# Patient Record
Sex: Male | Born: 1955 | Race: Black or African American | Hispanic: No | Smoking: Light tobacco smoker
Health system: Southern US, Community
[De-identification: ages and names within clinical notes are randomized; demographics above are authoritative.]

## PROBLEM LIST (undated history)

## (undated) DIAGNOSIS — E785 Hyperlipidemia, unspecified: Secondary | ICD-10-CM

## (undated) DIAGNOSIS — K279 Peptic ulcer, site unspecified, unspecified as acute or chronic, without hemorrhage or perforation: Secondary | ICD-10-CM

## (undated) DIAGNOSIS — H269 Unspecified cataract: Secondary | ICD-10-CM

## (undated) DIAGNOSIS — M1990 Unspecified osteoarthritis, unspecified site: Secondary | ICD-10-CM

## (undated) HISTORY — DX: Unspecified osteoarthritis, unspecified site: M19.90

## (undated) HISTORY — PX: CERVICAL FUSION: SHX112

## (undated) HISTORY — DX: Unspecified cataract: H26.9

## (undated) HISTORY — DX: Hyperlipidemia, unspecified: E78.5

## (undated) HISTORY — PX: FRACTURE SURGERY: SHX138

## (undated) HISTORY — PX: DENTAL SURGERY: SHX609

---

## 1998-01-23 ENCOUNTER — Emergency Department (HOSPITAL_COMMUNITY): Admission: EM | Admit: 1998-01-23 | Discharge: 1998-01-23 | Payer: Self-pay | Admitting: Emergency Medicine

## 1998-05-13 ENCOUNTER — Emergency Department (HOSPITAL_COMMUNITY): Admission: EM | Admit: 1998-05-13 | Discharge: 1998-05-13 | Payer: Self-pay | Admitting: Emergency Medicine

## 2000-01-06 ENCOUNTER — Emergency Department (HOSPITAL_COMMUNITY): Admission: EM | Admit: 2000-01-06 | Discharge: 2000-01-06 | Payer: Self-pay | Admitting: Emergency Medicine

## 2000-10-08 ENCOUNTER — Emergency Department (HOSPITAL_COMMUNITY): Admission: EM | Admit: 2000-10-08 | Discharge: 2000-10-09 | Payer: Self-pay | Admitting: Emergency Medicine

## 2000-10-09 ENCOUNTER — Encounter: Payer: Self-pay | Admitting: Emergency Medicine

## 2006-05-11 ENCOUNTER — Emergency Department (HOSPITAL_COMMUNITY): Admission: EM | Admit: 2006-05-11 | Discharge: 2006-05-12 | Payer: Self-pay | Admitting: Emergency Medicine

## 2008-08-03 DIAGNOSIS — B1710 Acute hepatitis C without hepatic coma: Secondary | ICD-10-CM | POA: Insufficient documentation

## 2010-07-31 ENCOUNTER — Ambulatory Visit: Payer: Self-pay | Admitting: Nurse Practitioner

## 2010-07-31 DIAGNOSIS — M545 Low back pain: Secondary | ICD-10-CM

## 2010-07-31 DIAGNOSIS — K279 Peptic ulcer, site unspecified, unspecified as acute or chronic, without hemorrhage or perforation: Secondary | ICD-10-CM | POA: Insufficient documentation

## 2010-09-04 NOTE — Letter (Signed)
Summary: Handout Printed  Printed Handout:  - Pelvic Inflammatory Disease (PID) 

## 2010-09-04 NOTE — Assessment & Plan Note (Signed)
Summary: NEW - Establish Care   Vital Signs:  Patient profile:   55 year old male Height:      71.50 inches Weight:      182.3 pounds BMI:     25.16 BSA:     2.04 Temp:     97.5 degrees F oral Pulse rate:   60 / minute Pulse rhythm:   regular Resp:     16 per minute BP sitting:   104 / 80  (left arm) Cuff size:   regular  Vitals Entered By: Levon Hedger (July 31, 2010 10:15 AM)  Nutrition Counseling: Patient's BMI is greater than 25 and therefore counseled on weight management options. CC: new establish, Abdominal Pain Is Patient Diabetic? No Pain Assessment Patient in pain? yes     Location: back Intensity: 2-3  Does patient need assistance? Functional Status Self care Ambulation Normal   CC:  new establish and Abdominal Pain.  History of Present Illness:  Pt into the office to establish care. No previous PCP  No PMH PSH - oral surgery and right hand surgery s/p fracture from a fall.  Social - s/p incarceration 06/2010 Pt decided that he needed a primary care home since he is uninsured so he decided to establish care.  No acute problems today  Dyspepsia History:      He has no alarm features of dyspepsia including no history of melena, hematochezia, dysphagia, persistent vomiting, or involuntary weight loss > 5%.  There is a prior history of GERD.  The dominant symptom is heartburn or acid reflux.  An H-2 blocker medication is currently being taken.     Habits & Providers  Alcohol-Tobacco-Diet     Alcohol drinks/day: 0     Tobacco Status: never  Exercise-Depression-Behavior     Does Patient Exercise: yes     Exercise Counseling: not indicated; exercise is adequate     Type of exercise: weights     Have you felt down or hopeless? no     Have you felt little pleasure in things? no     Depression Counseling: not indicated; screening negative for depression     Drug Use: never  Allergies (verified): 1)  ! Penicillin  Past History:  Past  Surgical History: right hand surgery - 2005 (s/p fracture)  Family History: mother - breast cancer survivor father - deceased (unknown history) 3 siblings (2 sisters, 1 brother) 1 sister - breast cancer survivor  Social History: no children tobacco - none ETOH - none Drug - noneSmoking Status:  never Drug Use:  never Does Patient Exercise:  yes  Review of Systems General:  Denies fever. CV:  Denies chest pain or discomfort. Resp:  Denies cough. GI:  Complains of indigestion; denies constipation, nausea, and vomiting. MS:  Complains of joint pain and low back pain.  Physical Exam  General:  alert.   Head:  normocephalic.   Eyes:  glasses Lungs:  normal breath sounds.   Heart:  normal rate and regular rhythm.   Abdomen:  normal bowel sounds.   Neurologic:  alert & oriented X3.   Skin:  color normal.   Psych:  Oriented X3.     Impression & Recommendations:  Problem # 1:  PEPTIC ULCER DISEASE (ICD-533.90)  His updated medication list for this problem includes:    Ranitidine Hcl 300 Mg Tabs (Ranitidine hcl) ..... One tablet by mouth nightly for stomach  Problem # 2:  LUMBAGO (ICD-724.2) stable at this time  Problem # 3:  NEED PROPHYLACTIC VACCINATION&INOCULATION FLU (ICD-V04.81) given today   Complete Medication List: 1)  Ranitidine Hcl 300 Mg Tabs (Ranitidine hcl) .... One tablet by mouth nightly for stomach  Other Orders: Flu Vaccine 35yrs + ([address]) Admin 1st Vaccine ([address])  Dyspepsia Assessment/Plan:  Step Therapy: GERD Treatment Protocols:    Step-1: started    H-2 blocker chosen: Ranitidine 150mg  by mouth at bedtime  Patient Instructions: 1)  Schedule an appointment at the next available for a complete physical exam. 2)  No food after midnight before this visit. 3)  You will get fasting labs, EKG, rectal/prostate, tdap 4)  You have been given the flu vaccine today. Prescriptions: RANITIDINE HCL 300 MG TABS (RANITIDINE HCL) One tablet by mouth  nightly for stomach  #30 x 3   Entered and Authorized by:   Lehman Prom FNP   Signed by:   Lehman Prom FNP on 07/31/2010   Method used:   Print then Give to Patient   RxID:   [id number]    Orders Added: 1)  New Patient Level III [30865] 2)  Flu Vaccine 58yrs + [78469] 3)  Admin 1st Vaccine [62952]   Immunizations Administered:  Influenza Vaccine # 1:    Vaccine Type: Fluvax 3+    Site: right deltoid    Mfr: GlaxoSmithKline    Dose: 0.5 ml    Route: IM    Given by: Levon Hedger    Exp. Date: 01/31/2011    Lot #: [id number]    VIS given: 02/25/10 version given July 31, 2010.  Flu Vaccine Consent Questions:    Do you have a history of severe allergic reactions to this vaccine? no    Any prior history of allergic reactions to egg and/or gelatin? no    Do you have a sensitivity to the preservative Thimersol? no    Do you have a past history of Guillan-Barre Syndrome? no    Do you currently have an acute febrile illness? no    Have you ever had a severe reaction to latex? no    Vaccine information given and explained to patient? yes    ndc  [phone]  Immunizations Administered:  Influenza Vaccine # 1:    Vaccine Type: Fluvax 3+    Site: right deltoid    Mfr: GlaxoSmithKline    Dose: 0.5 ml    Route: IM    Given by: Levon Hedger    Exp. Date: 01/31/2011    Lot #: [id number]    VIS given: 02/25/10 version given July 31, 2010.   Colonoscopy  Procedure date:  05/05/2010  Findings:      Done at [facility] Polyps x 2 removed Hemorrhoids x 1  Comments:      no family history of colon cancer

## 2010-09-04 NOTE — Letter (Signed)
Summary: Handout Printed  Printed Handout:  - Peptic Ulcers

## 2010-09-23 ENCOUNTER — Encounter: Payer: Self-pay | Admitting: Nurse Practitioner

## 2010-09-23 ENCOUNTER — Encounter (INDEPENDENT_AMBULATORY_CARE_PROVIDER_SITE_OTHER): Payer: Self-pay | Admitting: Nurse Practitioner

## 2010-09-23 DIAGNOSIS — K5900 Constipation, unspecified: Secondary | ICD-10-CM | POA: Insufficient documentation

## 2010-09-23 LAB — CONVERTED CEMR LAB
Bilirubin Urine: NEGATIVE
Blood in Urine, dipstick: NEGATIVE
Glucose, Urine, Semiquant: NEGATIVE
Ketones, urine, test strip: NEGATIVE
Nitrite: NEGATIVE
Protein, U semiquant: NEGATIVE
Rapid HIV Screen: NEGATIVE
Specific Gravity, Urine: 1.01
Urobilinogen, UA: 0.2
WBC Urine, dipstick: NEGATIVE
pH: 6

## 2010-09-24 ENCOUNTER — Encounter (INDEPENDENT_AMBULATORY_CARE_PROVIDER_SITE_OTHER): Payer: Self-pay | Admitting: Nurse Practitioner

## 2010-09-24 LAB — CONVERTED CEMR LAB
Cholesterol: 169 mg/dL (ref 0–200)
HDL: 51 mg/dL (ref >39)
LDL Cholesterol: 103 mg/dL — ABNORMAL HIGH (ref 0–99)
Total CHOL/HDL Ratio: 3.3
Triglycerides: 73 mg/dL (ref <150)
VLDL: 15 mg/dL (ref 0–40)

## 2010-09-25 ENCOUNTER — Telehealth (INDEPENDENT_AMBULATORY_CARE_PROVIDER_SITE_OTHER): Payer: Self-pay | Admitting: Nurse Practitioner

## 2010-09-25 ENCOUNTER — Encounter (INDEPENDENT_AMBULATORY_CARE_PROVIDER_SITE_OTHER): Payer: Self-pay | Admitting: Nurse Practitioner

## 2010-09-25 DIAGNOSIS — E0590 Thyrotoxicosis, unspecified without thyrotoxic crisis or storm: Secondary | ICD-10-CM | POA: Insufficient documentation

## 2010-09-25 LAB — CONVERTED CEMR LAB
Free T4: 1.13 ng/dL (ref 0.80–1.80)
T3, Free: 2.6 pg/mL (ref 2.3–4.2)

## 2010-09-26 ENCOUNTER — Encounter (INDEPENDENT_AMBULATORY_CARE_PROVIDER_SITE_OTHER): Payer: Self-pay | Admitting: *Deleted

## 2010-09-30 NOTE — Progress Notes (Signed)
Summary: Lab results  Phone Note Outgoing Call   Summary of Call: notify pt that all labs are ok except he is hyperthyroid which means his thyroid is working too much. This may be due to an increase in his metabolism with change in weight as discussed during recent office visit. I would advise having tsh, free t3 and free t4 recheck in 6-8 weeks to see if it has normalized Initial call taken by: Lehman Prom FNP,  September 25, 2010 8:54 AM  Follow-up for Phone Call        [phone] # disconnected or no longer in service.  Will mail letter Benjamin Bender  September 26, 2010 10:08 AM   New Problems: HYPERTHYROIDISM, SUBCLINICAL (ICD-242.90)   New Problems: HYPERTHYROIDISM, SUBCLINICAL (ICD-242.90)

## 2010-09-30 NOTE — Assessment & Plan Note (Signed)
Summary: Complete Physical Exam   Vital Signs:  Patient profile:   55 year old male Weight:      177.5 pounds BMI:     24.50 Temp:     97.1 degrees F oral Pulse rate:   72 / minute Pulse rhythm:   regular Resp:     16 per minute BP sitting:   112 / 70  (left arm) Cuff size:   regular  Vitals Entered By: Levon Hedger (September 23, 2010 9:53 AM) CC: CPP....constipated, hard stools....loss of appetite Is Patient Diabetic? No Pain Assessment Patient in pain? no       Does patient need assistance? Functional Status Self care Ambulation Normal  Vision Screening:Left eye w/o correction: 20 / 15-1 Right Eye w/o correction: 20 / 15 Both eyes w/o correction:  20/ 15        Vision Entered By: Levon Hedger (September 23, 2010 10:23 AM)   CC:  CPP....constipated and hard stools....loss of appetite.  History of Present Illness:  Pt into the office for a complete physical exam  Optho - Wears reading glasses but no recent eye exam  Dental - No recent dental exam  Tdap - Last was over 10 years ago.  Breast - Mother is a cancer survivor. Sister is currently undergoing treatment.  Known history of Hepatitis C - Pt did start treatment but he did not continue due to side effects  Habits & Providers  Alcohol-Tobacco-Diet     Alcohol drinks/day: 0     Tobacco Status: never  Exercise-Depression-Behavior     Does Patient Exercise: yes     Exercise Counseling: not indicated; exercise is adequate     Type of exercise: weights     Have you felt down or hopeless? no     Have you felt little pleasure in things? no     Depression Counseling: not indicated; screening negative for depression     Drug Use: never  Comments: PHQ-9 score - 3  Allergies (verified): 1)  ! Penicillin  Review of Systems General:  Denies fever. Eyes:  Denies blurring. ENT:  Denies earache. CV:  Denies chest pain or discomfort. Resp:  Denies cough. GI:  Complains of constipation; denies  abdominal pain, nausea, and vomiting; Admits to changing his diet recently to only fruit during the day and a full meal at night. Some change in his stools as a consequence. GU:  Denies discharge. MS:  Denies joint pain. Derm:  Denies dryness. Neuro:  Denies headaches. Psych:  Denies anxiety and depression. Endo:  Denies excessive urination.  Physical Exam  General:  alert.   Head:  normocephalic.   Eyes:  pupils round.   Ears:  bil TM with minimal cerumen Nose:  no nasal discharge.   Mouth:  poor dentition.   Neck:  supple.   Chest Wall:  no deformities.   Breasts:  no gynecomastia.   Lungs:  normal breath sounds.   Heart:  normal rate and regular rhythm.   Abdomen:  normal bowel sounds.   Rectal:  no masses.   Genitalia:  circumcised.  no scrotal masses Prostate:  no gland enlargement.   Msk:  normal ROM.   Neurologic:  alert & oriented X3.   Skin:  color normal.   Psych:  Oriented X3.     Impression & Recommendations:  Problem # 1:  ROUTINE GYNECOLOGICAL EXAMINATION (ICD-V72.31) PHQ-9 score = 3 rec optho and dental exam guaiac negative  Orders: T-PSA ([address]) T-CBC w/Diff (09811-91478) Rapid  HIV  [phone]) T-Syphilis Test (RPR) [phone]) T-TSH [phone]) T-GC Probe, urine [phone]) UA Dipstick w/o Micro (manual) (96295) EKG w/ Interpretation (93000) Hemoccult Guaiac-1 spec.(in office) (82270)  Problem # 2:  HEPATITIS C (ICD-070.51) will confirm with labs today ended treatment in 2010 Orders: T-Hepattis C Genotype, DNA (28413-24401) T-Hepatitis C Viral Load (02725-36644) Protime INR (03474)  Problem # 3:  PEPTIC ULCER DISEASE (ICD-533.90)  His updated medication list for this problem includes:    Ranitidine Hcl 300 Mg Tabs (Ranitidine hcl) ..... One tablet by mouth nightly for stomach  Orders: T-Lipid Profile (25956-38756) T-Comprehensive Metabolic Panel (43329-51884)  Problem # 4:  CONSTIPATION (ICD-564.00) handout given pt  recently had a drastic change in his diet which may have attributed to change in bowels colonscopy done in 2010  Complete Medication List: 1)  Ranitidine Hcl 300 Mg Tabs (Ranitidine hcl) .... One tablet by mouth nightly for stomach  Patient Instructions: 1)  You will be notified of the labs notified of the results 2)  You are due for a tetanus but none available today in office. You can get on your next visit 3)  Follow up as needed Prescriptions: RANITIDINE HCL 300 MG TABS (RANITIDINE HCL) One tablet by mouth nightly for stomach  #30 x 11   Entered and Authorized by:   Lehman Prom FNP   Signed by:   Lehman Prom FNP on 09/23/2010   Method used:   Print then Give to Patient   RxID:   [id number]    Orders Added: 1)  Est. Patient age 41-64 [99396] 2)  T-Lipid Profile [phone] 3)  T-Comprehensive Metabolic Panel [80053-22900] 4)  T-PSA [27062-37628] 5)  T-CBC w/Diff [31517-61607] 6)  Rapid HIV  [92370] 7)  T-Syphilis Test (RPR) [37106-26948] 8)  T-TSH [54627-03500] 9)  T-GC Probe, urine [93818-29937] 10)  UA Dipstick w/o Micro (manual) [81002] 11)  EKG w/ Interpretation [93000] 12)  Hemoccult Guaiac-1 spec.(in office) [82270] 13)  T-Hepattis C Genotype, DNA [87902-82925] 14)  T-Hepatitis C Viral Load [87522-80119] 15)  Protime INR [85610]     EKG  Procedure date:  09/23/2010  Findings:      NSR   Laboratory Results   Urine Tests  Date/Time Received: September 23, 2010 11:55 AM   Routine Urinalysis   Color: lt. yellow Appearance: Clear Glucose: negative   (Normal Range: Negative) Bilirubin: negative   (Normal Range: Negative) Ketone: negative   (Normal Range: Negative) Spec. Gravity: 1.010   (Normal Range: 1.003-1.035) Blood: negative   (Normal Range: Negative) pH: 6.0   (Normal Range: 5.0-8.0) Protein: negative   (Normal Range: Negative) Urobilinogen: 0.2   (Normal Range: 0-1) Nitrite: negative   (Normal Range: Negative) Leukocyte  Esterace: negative   (Normal Range: Negative)      Other Tests  Rapid HIV: negative    Prevention & Chronic Care Immunizations   Influenza vaccine: Fluvax 3+  (07/31/2010)    Tetanus booster: 08/03/1998: historical per pt   Td booster deferral: Not available  (09/23/2010)    Pneumococcal vaccine: Not documented  Colorectal Screening   Hemoccult: Not documented    Colonoscopy: Done at [facility] Polyps x 2 removed Hemorrhoids x 1  (05/05/2010)   Colonoscopy action/deferral: no family history of colon cancer  (05/05/2010)  Other Screening   PSA: Not documented   PSA ordered.   Smoking status: never  (09/23/2010)  Lipids   Total Cholesterol: Not documented   LDL: Not documented   LDL Direct: Not documented   HDL: Not documented  Triglycerides: Not documented

## 2010-09-30 NOTE — Letter (Signed)
Summary: *HSN Results Follow up  [facility]  [address] [place], [place] [address]   Phone: [phone]  Fax: [phone]      09/26/2010   Benjamin Bender [address] [place], [place]  [address]   Dear  Benjamin Bender,                            ____S.Drinkard,FNP   ____D. Gore,FNP       ____B. McPherson,MD   ____V. Rankins,MD    ____E. Mulberry,MD    ____N. Daphine Deutscher, FNP  ____D. Reche Dixon, MD    ____K. Philipp Deputy, MD    ____Other     This letter is to inform you that your recent test(s):     _______Lab Test Results         _______ is within acceptable limits  _______ requires a medication change  _______ requires a follow-up lab visit  _______ requires a follow-up visit with your provider   Comments:  We have been trying to reach you at [phone].  Please contact the office at your earliest convenience.       _________________________________________________________ If you have any questions, please contact our office                     Sincerely,  Levon Hedger [facility]

## 2010-09-30 NOTE — Progress Notes (Signed)
Summary: Office Visit//DEPRESSION SCREENIG  Office Visit//DEPRESSION SCREENIG   Imported By: Arta Bruce 09/23/2010 15:32:34  _____________________________________________________________________  External Attachment:    Type:   Image     Comment:   External Document

## 2010-09-30 NOTE — Assessment & Plan Note (Signed)
Summary: Lipids  Nurse Visit   Allergies: 1)  ! Penicillin  Orders Added: 1)  No Charge Patient Arrived (NCPA0) [NCPA0] 2)  T-Lipid Profile [[address]]

## 2010-09-30 NOTE — Letter (Signed)
Summary: Handout Printed  Printed Handout:  - Constipation, Easy-to-Read

## 2010-10-02 LAB — CONVERTED CEMR LAB
ALT: 17 units/L (ref 0–53)
AST: 20 units/L (ref 0–37)
BUN: 12 mg/dL (ref 6–23)
Basophils Absolute: 0 10*3/uL (ref 0.0–0.1)
Basophils Relative: 0 % (ref 0–1)
Creatinine, Ser: 1.03 mg/dL (ref 0.40–1.50)
Eosinophils Relative: 3 % (ref 0–5)
HCT: 45.7 % (ref 39.0–52.0)
HCV Quantitative: 7070000 intl units/mL — ABNORMAL HIGH (ref <43)
Hemoglobin: 15.3 g/dL (ref 13.0–17.0)
MCHC: 33.5 g/dL (ref 30.0–36.0)
MCV: 90.3 fL (ref 78.0–100.0)
Monocytes Absolute: 0.6 10*3/uL (ref 0.1–1.0)
Monocytes Relative: 9 % (ref 3–12)
RDW: 14 % (ref 11.5–15.5)
TSH: 0.161 microintl units/mL — ABNORMAL LOW (ref 0.350–4.500)
Total Bilirubin: 0.9 mg/dL (ref 0.3–1.2)

## 2011-02-28 ENCOUNTER — Emergency Department (HOSPITAL_COMMUNITY)
Admission: EM | Admit: 2011-02-28 | Discharge: 2011-02-28 | Disposition: A | Discharge status: Home / Self Care | Payer: No Typology Code available for payment source | Attending: Emergency Medicine | Admitting: Emergency Medicine

## 2011-02-28 DIAGNOSIS — Y9289 Other specified places as the place of occurrence of the external cause: Secondary | ICD-10-CM | POA: Insufficient documentation

## 2011-02-28 DIAGNOSIS — M542 Cervicalgia: Secondary | ICD-10-CM | POA: Insufficient documentation

## 2011-02-28 DIAGNOSIS — M25519 Pain in unspecified shoulder: Secondary | ICD-10-CM | POA: Insufficient documentation

## 2011-02-28 DIAGNOSIS — F17200 Nicotine dependence, unspecified, uncomplicated: Secondary | ICD-10-CM | POA: Insufficient documentation

## 2011-02-28 DIAGNOSIS — M62838 Other muscle spasm: Secondary | ICD-10-CM | POA: Insufficient documentation

## 2013-02-09 ENCOUNTER — Ambulatory Visit: Payer: No Typology Code available for payment source | Attending: Family Medicine | Admitting: Internal Medicine

## 2013-02-09 VITALS — BP 130/82 | HR 53 | Temp 99.1°F | Resp 16 | Ht 70.5 in | Wt 144.0 lb

## 2013-02-09 DIAGNOSIS — Z8619 Personal history of other infectious and parasitic diseases: Secondary | ICD-10-CM | POA: Insufficient documentation

## 2013-02-09 DIAGNOSIS — B182 Chronic viral hepatitis C: Secondary | ICD-10-CM

## 2013-02-09 DIAGNOSIS — R634 Abnormal weight loss: Secondary | ICD-10-CM | POA: Insufficient documentation

## 2013-02-09 NOTE — Progress Notes (Signed)
Patient ID: NYMIR RINGLER, male   DOB: 1956/02/10, 57 y.o.   MRN: [id number]  Patient Demographics  Ryzen Deady, is a 57 y.o. male  CSN: [id number]  MRN: [id number]  DOB - [birth year]  Outpatient Primary MD for the patient is No primary provider on file.   With History of -  Past medical history of hepatitis C, status post interferon treatment for few months but he quit due to side effects, status post colonoscopy with all removal 3 years ago.   in for   Chief Complaint  Patient presents with  . Establish Care  . Hepatitis C     HPI  Lennex Pietila  is a 57 y.o. male, with history of hepatitis C who did not complete his full treatment due to side effects, colonic polyps which were removed 3 years ago, of note patient had been incarcerated for few years and has been recently released from the hospital, he is here to establish care besides mild itching which is intermittent due to dry skin he has no other subjective complaints except unintentional 30-40 pound weight loss over the last 1 year .    Review of Systems    In addition to the HPI above,  No Fever-chills, No Headache, No changes with Vision or hearing, No problems swallowing food or Liquids, No Chest pain, Cough or Shortness of Breath, No Abdominal pain, No Nausea or Vommitting, Bowel movements are regular, No Blood in stool or Urine, No dysuria, No new skin rashes or bruises, No new joints pains-aches,  No new weakness, tingling, numbness in any extremity, No recent weight gain, No polyuria, polydypsia or polyphagia, No significant Mental Stressors.  A full 10 point Review of Systems was done, except as stated above, all other Review of Systems were negative.   Social History History  Substance Use Topics  . Smoking status: Light Tobacco Smoker -- 0.25 packs/day  . Smokeless tobacco: Not on file  . Alcohol Use: Yes     Comment: occasionally     Family History No history of liver cancer  Prior  to Admission medications   Not on File    Allergies  Allergen Reactions  . Penicillins     REACTION: fainting during childhood    Physical Exam  Vitals  Blood pressure 130/82, pulse 53, temperature 99.1 F (37.3 C), temperature source Oral, resp. rate 16, height 5' 10.5" (1.791 m), weight 144 lb (65.318 kg), SpO2 99.00%.   1. General thin African American male middle-aged sitting on the examination table in no apparent distress  2. Normal affect and insight, Not Suicidal or Homicidal, Awake Alert, Oriented X 3.  3. No F.N deficits, ALL C.Nerves Intact, Strength 5/5 all 4 extremities, Sensation intact all 4 extremities, Plantars down going.  4. Ears and Eyes appear Normal, Conjunctivae clear, PERRLA. Moist Oral Mucosa.  5. Supple Neck, No JVD, No cervical lymphadenopathy appriciated, No Carotid Bruits.  6. Symmetrical Chest wall movement, Good air movement bilaterally, CTAB.  7. RRR, No Gallops, Rubs or Murmurs, No Parasternal Heave.  8. Positive Bowel Sounds, Abdomen Soft, Non tender, No organomegaly appriciated,No rebound -guarding or rigidity.  9.  No Cyanosis, Normal Skin Turgor, No Skin Rash or Bruise.  10. Good muscle tone,  joints appear normal , no effusions, Normal ROM.  11. No Palpable Lymph Nodes in Neck or Axillae    Data Review  CBC No results found for this basename: WBC, HGB, HCT, PLT, MCV, MCH, MCHC, RDW, NEUTRABS, LYMPHSABS, MONOABS, EOSABS,  BASOSABS, BANDABS, BANDSABD,  in the last 168 hours ------------------------------------------------------------------------------------------------------------------  Chemistries  No results found for this basename: NA, K, CL, CO2, GLUCOSE, BUN, CREATININE, GFRCGP, CALCIUM, MG, AST, ALT, ALKPHOS, BILITOT,  in the last 168 hours ------------------------------------------------------------------------------------------------------------------ estimated creatinine clearance is 73.1 ml/min (by C-G formula based on  Cr of 1.03). ------------------------------------------------------------------------------------------------------------------ No results found for this basename: TSH, T4TOTAL, FREET3, T3FREE, THYROIDAB,  in the last 72 hours   Coagulation profile No results found for this basename: INR, PROTIME,  in the last 168 hours ------------------------------------------------------------------------------------------------------------------- No results found for this basename: DDIMER,  in the last 72 hours -------------------------------------------------------------------------------------------------------------------  Cardiac Enzymes No results found for this basename: CK, CKMB, TROPONINI, MYOGLOBIN,  in the last 168 hours ------------------------------------------------------------------------------------------------------------------ No components found with this basename: POCBNP,    ---------------------------------------------------------------------------------------------------------------  Urinalysis    Component Value Date/Time   COLORURINE lt. yellow 09/23/2010 0953   APPEARANCEUR Clear 09/23/2010 0953   LABSPEC 1.010 09/23/2010 0953   PHURINE 6.0 09/23/2010 0953   HGBUR negative 09/23/2010 0953   BILIRUBINUR negative 09/23/2010 0953   UROBILINOGEN 0.2 09/23/2010 0953   NITRITE negative 09/23/2010 0953       Assessment and plan  History of hepatitis C. Unintentional weight loss over the last few years. Patient had stopped his hepatitis C treatment midway due to side effects, I will refer him to GI to monitor his hepatitis C closely and to rule out any liver malignancy. We'll also order an HIV serology panel.  History of smoking requested to quit smoking.    Routine health maintenance.  Screening labs. CBC, CMP, TSH, A1c, lipid panel, PSA, HIV serology he will come back in a week for followup  Colonoscopy was done 3 years ago with polyps removal we will have him follow with  GI physician for his hepatitis.   Immunizations no tetanus vaccine available in the clinic today, we will give it to him his next visit        Leroy Sea M.D on 02/09/2013 at 4:38 PM

## 2013-02-09 NOTE — Progress Notes (Signed)
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 Denies symptoms of jaundice, but states he has dry scaling skin with itching and redness around both eyes.

## 2013-02-10 LAB — CBC
HCT: 42.4 % (ref 39.0–52.0)
MCH: 30.5 pg (ref 26.0–34.0)
MCV: 88.5 fL (ref 78.0–100.0)
Platelets: 146 10*3/uL — ABNORMAL LOW (ref 150–400)
RBC: 4.79 MIL/uL (ref 4.22–5.81)
WBC: 6 10*3/uL (ref 4.0–10.5)

## 2013-02-10 LAB — COMPLETE METABOLIC PANEL WITH GFR
ALT: 36 U/L (ref 0–53)
AST: 27 U/L (ref 0–37)
Alkaline Phosphatase: 79 U/L (ref 39–117)
BUN: 11 mg/dL (ref 6–23)
Calcium: 9.5 mg/dL (ref 8.4–10.5)
Chloride: 103 mEq/L (ref 96–112)
Creat: 1.1 mg/dL (ref 0.50–1.35)
Potassium: 4.1 mEq/L (ref 3.5–5.3)

## 2013-02-10 LAB — LIPID PANEL
Cholesterol: 150 mg/dL (ref 0–200)
HDL: 46 mg/dL (ref >39)
Total CHOL/HDL Ratio: 3.3 Ratio
Triglycerides: 138 mg/dL (ref <150)
VLDL: 28 mg/dL (ref 0–40)

## 2013-02-10 LAB — TSH: TSH: 1.469 u[IU]/mL (ref 0.350–4.500)

## 2013-02-14 ENCOUNTER — Ambulatory Visit: Payer: Self-pay

## 2013-02-23 ENCOUNTER — Ambulatory Visit: Payer: No Typology Code available for payment source | Attending: Family Medicine | Admitting: Family Medicine

## 2013-02-23 ENCOUNTER — Encounter: Payer: Self-pay | Admitting: Family Medicine

## 2013-02-23 ENCOUNTER — Telehealth: Payer: Self-pay | Admitting: Family Medicine

## 2013-02-23 DIAGNOSIS — B1920 Unspecified viral hepatitis C without hepatic coma: Secondary | ICD-10-CM | POA: Insufficient documentation

## 2013-02-23 NOTE — Progress Notes (Signed)
Patient ID: Benjamin Bender, male   DOB: 1956/01/22, 57 y.o.   MRN: [id number]  CC: follow up   HPI: Pt reports that he would like to have his test results.  Pt says that he is interested in pursuing further treatment for his Hep C.    Allergies  Allergen Reactions  . Penicillins     REACTION: fainting during childhood   No past medical history on file. No current outpatient prescriptions on file prior to visit.   No current facility-administered medications on file prior to visit.   No family history on file. History   Social History  . Marital Status: Divorced    Spouse Name: N/A    Number of Children: N/A  . Years of Education: N/A   Occupational History  . Not on file.   Social History Main Topics  . Smoking status: Light Tobacco Smoker -- 0.25 packs/day  . Smokeless tobacco: Not on file  . Alcohol Use: Yes     Comment: occasionally  . Drug Use: Yes    Special: Marijuana  . Sexually Active: Not on file   Other Topics Concern  . Not on file   Social History Narrative  . No narrative on file    Review of Systems  Constitutional: Negative for fever, chills, diaphoresis, activity change, appetite change and fatigue.  HENT: Negative for ear pain, nosebleeds, congestion, facial swelling, rhinorrhea, neck pain, neck stiffness and ear discharge.   Eyes: Negative for pain, discharge, redness, itching and visual disturbance.  Respiratory: Negative for cough, choking, chest tightness, shortness of breath, wheezing and stridor.   Cardiovascular: Negative for chest pain, palpitations and leg swelling.  Gastrointestinal: Negative for abdominal distention.  Genitourinary: Negative for dysuria, urgency, frequency, hematuria, flank pain, decreased urine volume, difficulty urinating and dyspareunia.  Musculoskeletal: Negative for back pain, joint swelling, arthralgias and gait problem.  Neurological: Negative for dizziness, tremors, seizures, syncope, facial asymmetry, speech  difficulty, weakness, light-headedness, numbness and headaches.  Hematological: Negative for adenopathy. Does not bruise/bleed easily.  Psychiatric/Behavioral: Negative for hallucinations, behavioral problems, confusion, dysphoric mood, decreased concentration and agitation.    Objective:   Filed Vitals:   02/23/13 1213  BP: 138/75  Pulse: 65  Temp: 98.3 F (36.8 C)  Resp: 16    Physical Exam  Constitutional: Appears well-developed and well-nourished. No distress.  HENT: Normocephalic. External right and left ear normal. Oropharynx is clear and moist.  Eyes: Conjunctivae and EOM are normal. PERRLA, no scleral icterus.  Neck: Normal ROM. Neck supple. No JVD. No tracheal deviation. No thyromegaly.  CVS: RRR, S1/S2 +, no murmurs, no gallops, no carotid bruit.  Pulmonary: Effort and breath sounds normal, no stridor, rhonchi, wheezes, rales.  Abdominal: Soft. BS +,  no distension, tenderness, rebound or guarding.  Musculoskeletal: Normal range of motion. No edema and no tenderness.  Lymphadenopathy: No lymphadenopathy noted, cervical, inguinal. Neuro: Alert. Normal reflexes, muscle tone coordination. No cranial nerve deficit. Skin: Skin is warm and dry. No rash noted. Not diaphoretic. No erythema. No pallor.  Psychiatric: Normal mood and affect. Behavior, judgment, thought content normal.   Lab Results  Component Value Date   WBC 6.0 02/09/2013   HGB 14.6 02/09/2013   HCT 42.4 02/09/2013   MCV 88.5 02/09/2013   PLT 146* 02/09/2013   Lab Results  Component Value Date   CREATININE 1.10 02/09/2013   BUN 11 02/09/2013   NA 141 02/09/2013   K 4.1 02/09/2013   CL 103 02/09/2013   CO2  29 02/09/2013    Lab Results  Component Value Date   HGBA1C 5.4 02/09/2013   Lipid Panel     Component Value Date/Time   CHOL 150 02/09/2013 1645   TRIG 138 02/09/2013 1645   HDL 46 02/09/2013 1645   CHOLHDL 3.3 02/09/2013 1645   VLDL 28 02/09/2013 1645   LDLCALC 76 02/09/2013 1645       Assessment and  plan:   Patient Active Problem List   Diagnosis Date Noted  . HYPERTHYROIDISM, SUBCLINICAL 09/25/2010  . CONSTIPATION 09/23/2010  . PEPTIC ULCER DISEASE 07/31/2010  . LUMBAGO 07/31/2010  . HEPATITIS C 08/03/2008   Pt is interested in pursuing treatment for Hepatitis C.    Referral to Hepatitis C clinic for eval and treatment consideration   The patient was counseled on the dangers of tobacco use, and was advised to quit.  Reviewed strategies to maximize success, including removing cigarettes and smoking materials from environment.   Weight loss - encouraged pt to try supplements like boost or ensure for extra calories  RTC in 4 months  The patient was given clear instructions to go to ER or return to medical center if symptoms don't improve, worsen or new problems develop.  The patient verbalized understanding.  The patient was told to call to get any lab results if not heard anything in the next week.    Rodney Langton, MD, CDE, FAAFP [facility] [place], [place]

## 2013-02-23 NOTE — Patient Instructions (Addendum)
Smoking Cessation, Tips for Success YOU CAN QUIT SMOKING If you are ready to quit smoking, congratulations! You have chosen to help yourself be healthier. Cigarettes bring nicotine, tar, carbon monoxide, and other irritants into your body. Your lungs, heart, and blood vessels will be able to work better without these poisons. There are many different ways to quit smoking. Nicotine gum, nicotine patches, a nicotine inhaler, or nicotine nasal spray can help with physical craving. Hypnosis, support groups, and medicines help break the habit of smoking. Here are some tips to help you quit for good.  Throw away all cigarettes.  Clean and remove all ashtrays from your home, work, and car.  On a card, write down your reasons for quitting. Carry the card with you and read it when you get the urge to smoke.  Cleanse your body of nicotine. Drink enough water and fluids to keep your urine clear or pale yellow. Do this after quitting to flush the nicotine from your body.  Learn to predict your moods. Do not let a bad situation be your excuse to have a cigarette. Some situations in your life might tempt you into wanting a cigarette.  Never have "just one" cigarette. It leads to wanting another and another. Remind yourself of your decision to quit.  Change habits associated with smoking. If you smoked while driving or when feeling stressed, try other activities to replace smoking. Stand up when drinking your coffee. Brush your teeth after eating. Sit in a different chair when you read the paper. Avoid alcohol while trying to quit, and try to drink fewer caffeinated beverages. Alcohol and caffeine may urge you to smoke.  Avoid foods and drinks that can trigger a desire to smoke, such as sugary or spicy foods and alcohol.  Ask people who smoke not to smoke around you.  Have something planned to do right after eating or having a cup of coffee. Take a walk or exercise to perk you up. This will help to keep you  from overeating.  Try a relaxation exercise to calm you down and decrease your stress. Remember, you may be tense and nervous for the first 2 weeks after you quit, but this will pass.  Find new activities to keep your hands busy. Play with a pen, coin, or rubber band. Doodle or draw things on paper.  Brush your teeth right after eating. This will help cut down on the craving for the taste of tobacco after meals. You can try mouthwash, too.  Use oral substitutes, such as lemon drops, carrots, a cinnamon stick, or chewing gum, in place of cigarettes. Keep them handy so they are available when you have the urge to smoke.  When you have the urge to smoke, try deep breathing.  Designate your home as a nonsmoking area.  If you are a heavy smoker, ask your caregiver about a prescription for nicotine chewing gum. It can ease your withdrawal from nicotine.  Reward yourself. Set aside the cigarette money you save and buy yourself something nice.  Look for support from others. Join a support group or smoking cessation program. Ask someone at home or at work to help you with your plan to quit smoking.  Always ask yourself, "Do I need this cigarette or is this just a reflex?" Tell yourself, "Today, I choose not to smoke," or "I do not want to smoke." You are reminding yourself of your decision to quit, even if you do smoke a cigarette. HOW WILL I FEEL WHEN   I QUIT SMOKING?  The benefits of not smoking start within days of quitting.  You may have symptoms of withdrawal because your body is used to nicotine (the addictive substance in cigarettes). You may crave cigarettes, be irritable, feel very hungry, cough often, get headaches, or have difficulty concentrating.  The withdrawal symptoms are only temporary. They are strongest when you first quit but will go away within 10 to 14 days.  When withdrawal symptoms occur, stay in control. Think about your reasons for quitting. Remind yourself that these are  signs that your body is healing and getting used to being without cigarettes.  Remember that withdrawal symptoms are easier to treat than the major diseases that smoking can cause.  Even after the withdrawal is over, expect periodic urges to smoke. However, these cravings are generally short-lived and will go away whether you smoke or not. Do not smoke!  If you relapse and smoke again, do not lose hope. Most smokers quit 3 times before they are successful.  If you relapse, do not give up! Plan ahead and think about what you will do the next time you get the urge to smoke. LIFE AS A NONSMOKER: MAKE IT FOR A MONTH, MAKE IT FOR LIFE Day 1: Hang this page where you will see it every day. Day 2: Get rid of all ashtrays, matches, and lighters. Day 3: Drink water. Breathe deeply between sips. Day 4: Avoid places with smoke-filled air, such as bars, clubs, or the smoking section of restaurants. Day 5: Keep track of how much money you save by not smoking. Day 6: Avoid boredom. Keep a good book with you or go to the movies. Day 7: Reward yourself! One week without smoking! Day 8: Make a dental appointment to get your teeth cleaned. Day 9: Decide how you will turn down a cigarette before it is offered to you. Day 10: Review your reasons for quitting. Day 11: Distract yourself. Stay active to keep your mind off smoking and to relieve tension. Take a walk, exercise, read a book, do a crossword puzzle, or try a new hobby. Day 12: Exercise. Get off the bus before your stop or use stairs instead of escalators. Day 13: Call on friends for support and encouragement. Day 14: Reward yourself! Two weeks without smoking! Day 15: Practice deep breathing exercises. Day 16: Bet a friend that you can stay a nonsmoker. Day 17: Ask to sit in nonsmoking sections of restaurants. Day 18: Hang up "No Smoking" signs. Day 19: Think of yourself as a nonsmoker. Day 20: Each morning, tell yourself you will not smoke. Day  21: Reward yourself! Three weeks without smoking! Day 22: Think of smoking in negative ways. Remember how it stains your teeth, gives you bad breath, and leaves you short of breath. Day 23: Eat a nutritious breakfast. Day 24:Do not relive your days as a smoker. Day 25: Hold a pencil in your hand when talking on the telephone. Day 26: Tell all your friends you do not smoke. Day 27: Think about how much better food tastes. Day 28: Remember, one cigarette is one too many. Day 29: Take up a hobby that will keep your hands busy. Day 30: Congratulations! One month without smoking! Give yourself a big reward. Your caregiver can direct you to community resources or hospitals for support, which may include:  Group support.  Education.  Hypnosis.  Subliminal therapy. Document Released: 04/17/2004 Document Revised: 10/12/2011 Document Reviewed: 05/06/2009 [facility] Patient Information 2014 [place], [place]. Hepatitis  C Hepatitis C is a viral infection of the liver. Infection may go undetected for months or years because symptoms may be absent or very mild. Chronic liver disease is the main danger of hepatitis C. This may lead to scarring of the liver (cirrhosis), liver failure, and liver cancer. CAUSES  Hepatitis C is caused by the hepatitis C virus (HCV). Formerly, hepatitis C infections were most commonly transmitted through blood transfusions. In the early 1990s, routine testing of donated blood for hepatitis C and exclusion of blood that tests positive for HCV began. Now, HCV is most commonly transmitted from person to person through injection drug use, sharing needles, or sex with an infected person. A caregiver may also get the infection from exposure to the blood of an infected patient by way of a cut or needle stick.  SYMPTOMS  Acute Phase Many cases of acute HCV infection are mild and cause few problems.Some people may not even realize they are sick.Symptoms in others may last a few weeks to  several months and include:  Feeling very tired.  Loss of appetite.  Nausea.  Vomiting.  Abdominal pain.  Dark yellow urine.  Yellow skin and eyes (jaundice).  Itching of the skin. Chronic Phase  Between 50% to 85% of people who get HCV infection become "chronic carriers." They often have no symptoms, but the virus stays in their body.They may spread the virus to others and can get long-term liver disease.  Many people with chronic HCV infection remain healthy for many years. However, up to 1 in 5 chronically infected people may develop severe liver diseases including scarring of the liver (cirrhosis), liver failure, or liver cancer. DIAGNOSIS  Diagnosis of hepatitis C infection is made by testing blood for the presence of hepatitis C viral particles called RNA. Other tests may also be done to measure the status of current liver function, exclude other liver problems, or assess liver damage. TREATMENT  Treatment with many antiviral drugs is available and recommended for some patients with chronic HCV infection. Drug treatment is generally considered appropriate for patients who:  Are 12 years of age or older.  Have a positive test for HCV particles in the blood.  Have a liver tissue sample (biopsy) that shows chronic hepatitis and significant scarring (fibrosis).  Do not have signs of liver failure.  Have acceptable blood test results that confirm the wellness of other body organs.  Are willing to be treated and conform to treatment requirements.  Have no other circumstances that would prevent treatment from being recommended (contraindications). All people who are offered and choose to receive drug treatment must understand that careful medical follow up for many months and even years is crucial in order to make successful care possible. The goal of drug treatment is to eliminate any evidence of HCV in the blood on a long-term basis. This is called a "sustained virologic  response" or SVR. Achieving a SVR is associated with a decrease in the chance of life-threatening liver problems, need for a liver transplant, liver cancer rates, and liver-related complications. Successful treatment currently requires taking treatment drugs for at least 24 weeks and up to 72 weeks. An injected drug (interferon) given weekly and an oral antiviral medicine taken daily are usually prescribed. Side effects from these drugs are common and some may be very serious. Your response to treatment must be carefully monitored by both you and your caregiver throughout the entire treatment period. PREVENTION There is no vaccine for hepatitis C. The only  way to prevent the disease is to reduce the risk of exposure to the virus.   Avoid sharing drug needles or personal items like toothbrushes, razors, and nail clippers with an infected person.  Healthcare workers need to avoid injuries and wear appropriate protective equipment such as gloves, gowns, and face masks when performing invasive medical or nursing procedures. HOME CARE INSTRUCTIONS  To avoid making your liver disease worse:  Strictly avoid drinking alcohol.  Carefully review all new prescriptions of medicines with your caregiver. Ask your caregiver which drugs you should avoid. The following drugs are toxic to the liver, and your caregiver may tell you to avoid them:  Isoniazid.  Methyldopa.  Acetaminophen.  Anabolic steroids (muscle-building drugs).  Erythromycin.  Oral contraceptives (birth control pills).  Check with your caregiver to make sure medicine you are currently taking will not be harmful.  Periodic blood tests may be required. Follow your caregiver's advice about when you should have blood tests.  Avoid a sexual relationship until advised otherwise by your caregiver.  Avoid activities that could expose other people to your blood. Examples include sharing a toothbrush, nail clippers, razors, and needles.  Bed  rest is not necessary, but it may make you feel better. Recovery time is not related to the amount of rest you receive.  This infection is contagious. Follow your caregiver's instructions in order to avoid spread of the infection. SEEK IMMEDIATE MEDICAL CARE IF:  You have increasing fatigue or weakness.  You have an oral temperature above 102 F (38.9 C), not controlled by medicine.  You develop loss of appetite, nausea, or vomiting.  You develop jaundice.  You develop easy bruising or bleeding.  You develop any severe problems as a result of your treatment. MAKE SURE YOU:   Understand these instructions.  Will watch your condition.  Will get help right away if you are not doing well or get worse. Document Released: 07/17/2000 Document Revised: 10/12/2011 Document Reviewed: 11/19/2010 [facility] Patient Information 2014 [place], [place].

## 2013-02-23 NOTE — Telephone Encounter (Signed)
Pt calling Arna Medici back about referral.

## 2013-02-23 NOTE — Progress Notes (Signed)
HERE TO F/U LAB RESULTS X 2 WEEKS AGO. DENIES PAIN.VSS

## 2013-02-27 NOTE — Telephone Encounter (Signed)
lvm with relative for him to call me back

## 2013-06-08 ENCOUNTER — Other Ambulatory Visit: Payer: Self-pay

## 2017-06-04 ENCOUNTER — Encounter (HOSPITAL_BASED_OUTPATIENT_CLINIC_OR_DEPARTMENT_OTHER): Payer: Self-pay | Admitting: Adult Health

## 2017-06-04 ENCOUNTER — Emergency Department (HOSPITAL_BASED_OUTPATIENT_CLINIC_OR_DEPARTMENT_OTHER)
Admission: EM | Admit: 2017-06-04 | Discharge: 2017-06-04 | Disposition: A | Discharge status: Home / Self Care | Payer: Self-pay | Attending: Emergency Medicine | Admitting: Emergency Medicine

## 2017-06-04 DIAGNOSIS — K5900 Constipation, unspecified: Secondary | ICD-10-CM | POA: Insufficient documentation

## 2017-06-04 DIAGNOSIS — F17210 Nicotine dependence, cigarettes, uncomplicated: Secondary | ICD-10-CM | POA: Insufficient documentation

## 2017-06-04 DIAGNOSIS — R1013 Epigastric pain: Secondary | ICD-10-CM | POA: Insufficient documentation

## 2017-06-04 DIAGNOSIS — R1010 Upper abdominal pain, unspecified: Secondary | ICD-10-CM | POA: Insufficient documentation

## 2017-06-04 HISTORY — DX: Peptic ulcer, site unspecified, unspecified as acute or chronic, without hemorrhage or perforation: K27.9

## 2017-06-04 MED ORDER — RANITIDINE HCL 300 MG PO TABS: 300.0000 mg | ORAL_TABLET | Freq: Every day | ORAL | 0 refills | Status: DC

## 2017-06-04 MED ORDER — POLYETHYLENE GLYCOL 3350 17 G PO PACK: 17.0000 g | PACK | Freq: Every day | ORAL | 0 refills | Status: DC

## 2017-06-04 NOTE — ED Provider Notes (Signed)
[facility] EMERGENCY DEPARTMENT Provider Note   CSN: [id number] Arrival date & time: 06/04/17  1025     History   Chief Complaint Chief Complaint  Patient presents with  . Constipation    HPI Benjamin Bender is a 61 y.o. male.  HPI Patient presents with constipation.  States he has had no bowel movement in the last week.  Did take a milk of magnesia laxative this morning and had some mild bowel movements.  States he was having severe upper abdominal pain earlier.  States he normally does not go a long time without bowel movements.  He has however a [facility] cocaine detox and states his diet is not his normal diet.  No fevers.  No blood in the stool but states his stool is a little darker.  No nausea or vomiting.  States the pain was severe earlier today but now feels as if it is moved down and has improved.  History of "healed ulcers".  Previously had been on Zantac.  No previous aortic history. Past Medical History:  Diagnosis Date  . Peptic ulcer     Patient Active Problem List   Diagnosis Date Noted  . Hepatitis C infection 02/23/2013  . HYPERTHYROIDISM, SUBCLINICAL 09/25/2010  . CONSTIPATION 09/23/2010  . PEPTIC ULCER DISEASE 07/31/2010  . LUMBAGO 07/31/2010  . HEPATITIS C 08/03/2008    No past surgical history on file.     Home Medications    Prior to Admission medications   Medication Sig Start Date End Date Taking? Authorizing Provider  polyethylene glycol (MIRALAX / GLYCOLAX) packet Take 17 g by mouth daily. 06/04/17   Benjiman CorePickering, Tu Shimmel, MD  ranitidine (ZANTAC) 300 MG tablet Take 1 tablet (300 mg total) by mouth at bedtime. 06/04/17   Benjiman CorePickering, Tyvion Edmondson, MD    Family History No family history on file.  Social History Social History  Substance Use Topics  . Smoking status: Light Tobacco Smoker    Packs/day: 0.25  . Smokeless tobacco: Not on file  . Alcohol use Yes     Comment: occasionally     Allergies   Penicillins   Review  of Systems Review of Systems  Constitutional: Negative for appetite change.  HENT: Negative for congestion.   Respiratory: Negative for shortness of breath.   Gastrointestinal: Positive for abdominal pain and constipation. Negative for blood in stool.  Genitourinary: Negative for flank pain.  Musculoskeletal: Negative for back pain.  Neurological: Negative for headaches.  Hematological: Negative for adenopathy.  Psychiatric/Behavioral: Negative for confusion.     Physical Exam Updated Vital Signs BP 136/83   Pulse (!) 58   Temp 98.2 F (36.8 C) (Oral)   Resp 18   SpO2 100%   Physical Exam  Constitutional: He appears well-developed.  HENT:  Head: Atraumatic.  Eyes: EOM are normal.  Neck: Neck supple.  Cardiovascular: Normal rate.   Pulmonary/Chest: Effort normal.  Abdominal:  Mild epigastric tenderness without rebound or guarding.  No mass.  Musculoskeletal: He exhibits no edema.  Neurological: He is alert.  Skin: Skin is warm. Capillary refill takes less than 2 seconds.     ED Treatments / Results  Labs (all labs ordered are listed, but only abnormal results are displayed) Labs Reviewed - No data to display  EKG  EKG Interpretation None       Radiology No results found.  Procedures Procedures (including critical care time)  Medications Ordered in ED Medications - No data to display   Initial  Impression / Assessment and Plan / ED Course  I have reviewed the triage vital signs and the nursing notes.  Pertinent labs & imaging results that were available during my care of the patient were reviewed by me and considered in my medical decision making (see chart for details).     Patient with epigastric pain and constipation.  Has had new diet due to his substance abuse treatment.  This is likely contributing.  Had some relief after bowel movement earlier today.  We will treat with MiraLAX for that and will start him back on his Zantac for previous ulcers.   Could have a gastritis with this too.  Will discharge home and follow-up as needed.  After discussion with patient we determined that blood work would likely not add much at this time but will need to be followed up if the symptoms do not improve.  Final Clinical Impressions(s) / ED Diagnoses   Final diagnoses:  Constipation, unspecified constipation type  Epigastric pain    New Prescriptions Discharge Medication List as of 06/04/2017 10:54 AM    START taking these medications   Details  polyethylene glycol (MIRALAX / GLYCOLAX) packet Take 17 g by mouth daily., Starting Fri 06/04/2017, Print    ranitidine (ZANTAC) 300 MG tablet Take 1 tablet (300 mg total) by mouth at bedtime., Starting Fri 06/04/2017, Print         Benjiman Core, MD 06/04/17 [phone]

## 2017-06-04 NOTE — ED Triage Notes (Signed)
Presents with abdominal pain, has not had a BM in one week. [facility] and has had a diet change due to being there for 2 weeks. He states they do not serve a lot of vegetables and fruits. HE took a laxative this AM and had some relief of stool, however it was hard and balled. He has a history of Peptic Ulcer disease he denies blood in stool, however states his stool is dark. HE also endorses mid abdominal pain.

## 2017-11-16 ENCOUNTER — Other Ambulatory Visit: Payer: Self-pay

## 2017-11-16 ENCOUNTER — Encounter (HOSPITAL_COMMUNITY): Payer: Self-pay | Admitting: Emergency Medicine

## 2017-11-16 ENCOUNTER — Emergency Department (HOSPITAL_COMMUNITY)
Admission: EM | Admit: 2017-11-16 | Discharge: 2017-11-17 | Disposition: A | Discharge status: Home / Self Care | Payer: Self-pay | Attending: Emergency Medicine | Admitting: Emergency Medicine

## 2017-11-16 DIAGNOSIS — S0502XA Injury of conjunctiva and corneal abrasion without foreign body, left eye, initial encounter: Secondary | ICD-10-CM | POA: Insufficient documentation

## 2017-11-16 DIAGNOSIS — F17200 Nicotine dependence, unspecified, uncomplicated: Secondary | ICD-10-CM | POA: Insufficient documentation

## 2017-11-16 DIAGNOSIS — Y999 Unspecified external cause status: Secondary | ICD-10-CM | POA: Insufficient documentation

## 2017-11-16 DIAGNOSIS — Y929 Unspecified place or not applicable: Secondary | ICD-10-CM | POA: Insufficient documentation

## 2017-11-16 DIAGNOSIS — Z79899 Other long term (current) drug therapy: Secondary | ICD-10-CM | POA: Insufficient documentation

## 2017-11-16 DIAGNOSIS — Y939 Activity, unspecified: Secondary | ICD-10-CM | POA: Insufficient documentation

## 2017-11-16 DIAGNOSIS — X58XXXA Exposure to other specified factors, initial encounter: Secondary | ICD-10-CM | POA: Insufficient documentation

## 2017-11-16 NOTE — ED Triage Notes (Signed)
Pt to ED with c/o left eye pain, onset yesterday.  On known injury.  Pt st's eye is sensitive to light

## 2017-11-17 MED ORDER — KETOROLAC TROMETHAMINE 0.5 % OP SOLN
1.0000 [drp] | Freq: Four times a day (QID) | OPHTHALMIC | Status: DC
  Administered 2017-11-17: 1 [drp] via OPHTHALMIC
  Filled 2017-11-17: qty 3

## 2017-11-17 MED ORDER — POLYMYXIN B-TRIMETHOPRIM 10000-0.1 UNIT/ML-% OP SOLN
1.0000 [drp] | OPHTHALMIC | Status: DC
  Administered 2017-11-17: 1 [drp] via OPHTHALMIC
  Filled 2017-11-17: qty 10

## 2017-11-17 MED ORDER — TETRACAINE HCL 0.5 % OP SOLN
1.0000 [drp] | Freq: Once | OPHTHALMIC | Status: AC
  Administered 2017-11-17: 1 [drp] via OPHTHALMIC
  Filled 2017-11-17: qty 4

## 2017-11-17 MED ORDER — FLUORESCEIN SODIUM 1 MG OP STRP
1.0000 | ORAL_STRIP | Freq: Once | OPHTHALMIC | Status: AC
  Administered 2017-11-17: 1 via OPHTHALMIC
  Filled 2017-11-17: qty 1

## 2017-11-17 NOTE — Discharge Instructions (Signed)
Continue using drops as directed. Follow-up with eye doctor-- call this morning to make appt. Return to the ED for new or worsening symptoms.

## 2017-11-17 NOTE — ED Notes (Signed)
Pt. In E37 with tonopen at bedside.

## 2017-11-17 NOTE — ED Provider Notes (Signed)
[facility] Provider Note   CSN: [id number] Arrival date & time: 11/16/17  2329     History   Chief Complaint Chief Complaint  Patient presents with  . Eye Pain    HPI Benjamin Bender is a 62 y.o. male.  The history is provided by the patient and medical records.  Eye Pain      62 y.o. M with hx of hep C, peptic ulcers, presenting to the ED for left eye pain.  Patient states he intermittently has pain in his eyes, lasting for approx 1-2 seconds at a time.  States this resolves spontaneously and is so brief he never paid much attention to it.  States it felt more like "eye strain pain".  States over the past 3 days he has had nearly constant pain in the left eye.  States his eye has been watering and is very sensitive to light.  States it occasionally has been itching as well.  He has not had any loss of vision, blurred vision, or peripheral field loss.  He wears glasses for reading, no contact lenses.  No recent eye or facial trauma he can recall.  No FB exposure to eye.  States he saw eye doctor years ago in [place] but everything was normal at that time.  Past Medical History:  Diagnosis Date  . Peptic ulcer     Patient Active Problem List   Diagnosis Date Noted  . Hepatitis C infection 02/23/2013  . HYPERTHYROIDISM, SUBCLINICAL 09/25/2010  . CONSTIPATION 09/23/2010  . PEPTIC ULCER DISEASE 07/31/2010  . LUMBAGO 07/31/2010  . HEPATITIS C 08/03/2008    Past Surgical History:  Procedure Laterality Date  . FRACTURE SURGERY          Home Medications    Prior to Admission medications   Medication Sig Start Date End Date Taking? Authorizing Provider  polyethylene glycol (MIRALAX / GLYCOLAX) packet Take 17 g by mouth daily. 06/04/17   Benjiman CorePickering, Nathan, MD  ranitidine (ZANTAC) 300 MG tablet Take 1 tablet (300 mg total) by mouth at bedtime. 06/04/17   Benjiman CorePickering, Nathan, MD    Family History No family history on file.  Social  History Social History   Tobacco Use  . Smoking status: Light Tobacco Smoker    Packs/day: 0.25  . Smokeless tobacco: Never Used  Substance Use Topics  . Alcohol use: Yes    Comment: occasionally  . Drug use: Yes    Types: Marijuana     Allergies   Penicillins   Review of Systems Review of Systems  Eyes: Positive for pain.  All other systems reviewed and are negative.    Physical Exam Updated Vital Signs BP 121/77 (BP Location: Right Arm)   Pulse 66   Temp 98.1 F (36.7 C) (Oral)   Resp 18   Ht 5\' 10"  (1.778 m)   Wt 68 kg (150 lb)   SpO2 100%   BMI 21.52 kg/m   Physical Exam  Constitutional: He is oriented to person, place, and time. He appears well-developed and well-nourished.  HENT:  Head: Normocephalic and atraumatic.  Mouth/Throat: Oropharynx is clear and moist.  Eyes: Pupils are equal, round, and reactive to light. Conjunctivae and EOM are normal.  No lid edema or erythema, left conjunctival is injected and eyes tearing, there is apparent photophobia, EOMs fully intact, no nystagmus, pupils symmetric and reactive bilaterally, no field cuts, normal confrontation Fluorescein stain with evidence of corneal abrasion of the left inferior outer  margin.  There is no uptake. IOP 15 in both eyes  Neck: Normal range of motion.  Cardiovascular: Normal rate, regular rhythm and normal heart sounds.  Pulmonary/Chest: Effort normal and breath sounds normal.  Abdominal: Soft. Bowel sounds are normal.  Musculoskeletal: Normal range of motion.  Neurological: He is alert and oriented to person, place, and time.  Skin: Skin is warm and dry.  Psychiatric: He has a normal mood and affect.  Nursing note and vitals reviewed.    ED Treatments / Results  Labs (all labs ordered are listed, but only abnormal results are displayed) Labs Reviewed - No data to display  EKG None  Radiology No results found.  Procedures Procedures (including critical care  time)  Medications Ordered in ED Medications  ketorolac (ACULAR) 0.5 % ophthalmic solution 1 drop (1 drop Left Eye Given 11/17/17 0501)  trimethoprim-polymyxin b (POLYTRIM) ophthalmic solution 1 drop (1 drop Left Eye Given 11/17/17 0501)  tetracaine (PONTOCAINE) 0.5 % ophthalmic solution 1 drop (1 drop Left Eye Given 11/17/17 0410)  fluorescein ophthalmic strip 1 strip (1 strip Left Eye Given 11/17/17 0410)     Initial Impression / Assessment and Plan / ED Course  I have reviewed the triage vital signs and the nursing notes.  Pertinent labs & imaging results that were available during my care of the patient were reviewed by me and considered in my medical decision making (see chart for details).  62 year old male here with left eye pain.  Has had intermittent issues with very transient and brief eye pain for the past several years, pain over the past 3 days has been constant.  Has associated photophobia and tearing of the left eye.  Denies any injury, trauma, chemical, or foreign body exposure to the eye.  He wears reading glasses but no contact lenses.  He has no lid edema or erythema on exam.  Conjunctive is injected and tearing.  He has obvious photophobia.  Pressures in both eyes normal.  Fluprescein stain of right eye is normal, left eye with evidence of corneal abrasion of the inferior outer aspect.  There is no uptake.  IOP's in both eyes normal at 15.  Will start on Acular and Polytrim drops, refer to ophthalmology for follow-up.  Final Clinical Impressions(s) / ED Diagnoses   Final diagnoses:  Abrasion of left cornea, initial encounter    ED Discharge Orders    None       Garlon Hatchet, [name] 11/17/17 0534    Shon Baton, MD 11/17/17 [phone]

## 2018-07-06 ENCOUNTER — Emergency Department (HOSPITAL_COMMUNITY)
Admission: EM | Admit: 2018-07-06 | Discharge: 2018-07-06 | Disposition: A | Discharge status: Home / Self Care | Payer: PRIVATE HEALTH INSURANCE | Attending: Emergency Medicine | Admitting: Emergency Medicine

## 2018-07-06 ENCOUNTER — Emergency Department (HOSPITAL_COMMUNITY): Payer: PRIVATE HEALTH INSURANCE

## 2018-07-06 ENCOUNTER — Encounter (HOSPITAL_COMMUNITY): Payer: Self-pay | Admitting: Emergency Medicine

## 2018-07-06 DIAGNOSIS — K5900 Constipation, unspecified: Secondary | ICD-10-CM

## 2018-07-06 DIAGNOSIS — R109 Unspecified abdominal pain: Secondary | ICD-10-CM

## 2018-07-06 DIAGNOSIS — R1084 Generalized abdominal pain: Secondary | ICD-10-CM | POA: Insufficient documentation

## 2018-07-06 DIAGNOSIS — F17200 Nicotine dependence, unspecified, uncomplicated: Secondary | ICD-10-CM | POA: Insufficient documentation

## 2018-07-06 DIAGNOSIS — Z79899 Other long term (current) drug therapy: Secondary | ICD-10-CM | POA: Insufficient documentation

## 2018-07-06 LAB — COMPREHENSIVE METABOLIC PANEL
ALT: 16 U/L (ref 0–44)
AST: 23 U/L (ref 15–41)
Albumin: 3.8 g/dL (ref 3.5–5.0)
Alkaline Phosphatase: 51 U/L (ref 38–126)
Anion gap: 11 (ref 5–15)
BUN: 9 mg/dL (ref 8–23)
CO2: 22 mmol/L (ref 22–32)
CREATININE: 1.01 mg/dL (ref 0.61–1.24)
Calcium: 8.6 mg/dL — ABNORMAL LOW (ref 8.9–10.3)
Chloride: 104 mmol/L (ref 98–111)
Glucose, Bld: 105 mg/dL — ABNORMAL HIGH (ref 70–99)
POTASSIUM: 4.1 mmol/L (ref 3.5–5.1)
Sodium: 137 mmol/L (ref 135–145)
Total Bilirubin: 0.5 mg/dL (ref 0.3–1.2)
Total Protein: 6.9 g/dL (ref 6.5–8.1)

## 2018-07-06 LAB — URINALYSIS, ROUTINE W REFLEX MICROSCOPIC
Bilirubin Urine: NEGATIVE
Glucose, UA: NEGATIVE mg/dL
Hgb urine dipstick: NEGATIVE
KETONES UR: NEGATIVE mg/dL
LEUKOCYTES UA: NEGATIVE
NITRITE: NEGATIVE
PH: 6 (ref 5.0–8.0)
PROTEIN: NEGATIVE mg/dL
Specific Gravity, Urine: 1.021 (ref 1.005–1.030)

## 2018-07-06 LAB — CBC
HCT: 43.7 % (ref 39.0–52.0)
HEMOGLOBIN: 14.3 g/dL (ref 13.0–17.0)
MCH: 29.7 pg (ref 26.0–34.0)
MCHC: 32.7 g/dL (ref 30.0–36.0)
MCV: 90.7 fL (ref 80.0–100.0)
NRBC: 0 % (ref 0.0–0.2)
Platelets: 112 10*3/uL — ABNORMAL LOW (ref 150–400)
RBC: 4.82 MIL/uL (ref 4.22–5.81)
RDW: 13.3 % (ref 11.5–15.5)
WBC: 3.6 10*3/uL — AB (ref 4.0–10.5)

## 2018-07-06 LAB — I-STAT CG4 LACTIC ACID, ED: LACTIC ACID, VENOUS: 0.91 mmol/L (ref 0.5–1.9)

## 2018-07-06 LAB — LIPASE, BLOOD: Lipase: 32 U/L (ref 11–51)

## 2018-07-06 MED ORDER — FENTANYL CITRATE (PF) 100 MCG/2ML IJ SOLN
25.0000 ug | Freq: Once | INTRAMUSCULAR | Status: AC
Start: 2018-07-06 — End: 2018-07-06
  Administered 2018-07-06: 25 ug via INTRAVENOUS
  Filled 2018-07-06: qty 2

## 2018-07-06 MED ORDER — IOHEXOL 300 MG/ML  SOLN
100.0000 mL | Freq: Once | INTRAMUSCULAR | Status: AC | PRN
  Administered 2018-07-06: 100 mL via INTRAVENOUS

## 2018-07-06 MED ORDER — SODIUM CHLORIDE 0.9 % IV BOLUS
1000.0000 mL | Freq: Once | INTRAVENOUS | Status: AC
  Administered 2018-07-06: 1000 mL via INTRAVENOUS

## 2018-07-06 NOTE — Discharge Instructions (Addendum)
You were evaluated in the Emergency Department and after careful evaluation, we did not find any emergent condition requiring admission or further testing in the hospital.  Your symptoms today seem to be due to a viral illness as well as constipation.  It will be important to treat constipation in order to feel better.  I recommend MiraLAX, multiple doses throughout the day with a maximum of 6 doses per day until you achieve soft and easily passable stools.  You should slow down or stop taking this medication if you are beginning to experience watery diarrhea.  This medication is found over-the-counter at any pharmacy.  If you are not having success with this medicine, you could also try magnesium citrate as a single dose, or Dulcolax suppositories.  We discussed the abnormal but likely benign lesions found on your CT scan.  Please inform your primary care doctor of these findings so that he or she may schedule outpatient MRI.  Please return to the Emergency Department if you experience any worsening of your condition.  We encourage you to follow up with a primary care provider.  Thank you for allowing us to be a part of your care.

## 2018-07-06 NOTE — ED Notes (Signed)
Patient able to ambulate independently  

## 2018-07-06 NOTE — ED Triage Notes (Signed)
Pt presents to ED for assessment of abdominal pain with flu-like symptoms since Friday.  C/o constipation ("no bowel movement in 1 week"), c/o cough, congestion, back pain, headache, and generalized malaise and fatigue.

## 2018-07-06 NOTE — ED Notes (Signed)
Given urine sample cup, not able to pee at this time.

## 2018-07-06 NOTE — ED Provider Notes (Signed)
[facility] Provider Note MRN:  [id number]  Arrival date & time: 07/06/18     Chief Complaint   Abdominal Pain   History of Present Illness   Benjamin Bender is a 62 y.o. year-old male with a history of hepatitis C presenting to the ED with chief complaint of flank pain.  Patient is endorsing general illness for the past 6 days.  Endorsing left flank pain, dry cough, runny nose, sore throat, cold-like symptoms, mild frontal dull headache, no vision change, no chest pain, no shortness of breath.  Symptoms are constant, progressively worsening, moderate in severity.  No exacerbating or alleviating factors.  No bowel movement for 1 week.  Review of Systems  A complete 10 system review of systems was obtained and all systems are negative except as noted in the HPI and PMH.   Patient's Health History    Past Medical History:  Diagnosis Date  . Peptic ulcer     Past Surgical History:  Procedure Laterality Date  . FRACTURE SURGERY      History reviewed. No pertinent family history.  Social History   Socioeconomic History  . Marital status: Divorced    Spouse name: Not on file  . Number of children: Not on file  . Years of education: Not on file  . Highest education level: Not on file  Occupational History  . Not on file  Social Needs  . Financial resource strain: Not on file  . Food insecurity:    Worry: Not on file    Inability: Not on file  . Transportation needs:    Medical: Not on file    Non-medical: Not on file  Tobacco Use  . Smoking status: Light Tobacco Smoker    Packs/day: 0.25  . Smokeless tobacco: Never Used  Substance and Sexual Activity  . Alcohol use: Yes    Comment: occasionally  . Drug use: Yes    Types: Marijuana  . Sexual activity: Not on file  Lifestyle  . Physical activity:    Days per week: Not on file    Minutes per session: Not on file  . Stress: Not on file  Relationships  . Social  connections:    Talks on phone: Not on file    Gets together: Not on file    Attends religious service: Not on file    Active member of club or organization: Not on file    Attends meetings of clubs or organizations: Not on file    Relationship status: Not on file  . Intimate partner violence:    Fear of current or ex partner: Not on file    Emotionally abused: Not on file    Physically abused: Not on file    Forced sexual activity: Not on file  Other Topics Concern  . Not on file  Social History Narrative  . Not on file     Physical Exam  Vital Signs and Nursing Notes reviewed Vitals:   07/06/18 1637 07/06/18 1929  BP: 102/72 124/79  Pulse: 68   Resp: 14 16  Temp:    SpO2: 99% 98%    CONSTITUTIONAL: Tired-appearing, NAD NEURO:  Alert and oriented x 3, no focal deficits EYES:  eyes equal and reactive ENT/NECK:  no LAD, no JVD CARDIO: Regular rate, well-perfused, normal S1 and S2 PULM:  CTAB no wheezing or rhonchi GI/GU:  normal bowel sounds, non-distended, mild diffuse tenderness to palpation MSK/SPINE:  No gross deformities, no edema SKIN:  no rash, atraumatic PSYCH:  Appropriate speech and behavior  Diagnostic and Interventional Summary    EKG Interpretation  Date/Time:    Ventricular Rate:    PR Interval:    QRS Duration:   QT Interval:    QTC Calculation:   R Axis:     Text Interpretation:        Labs Reviewed  COMPREHENSIVE METABOLIC PANEL - Abnormal; Notable for the following components:      Result Value   Glucose, Bld 105 (*)    Calcium 8.6 (*)    All other components within normal limits  CBC - Abnormal; Notable for the following components:   WBC 3.6 (*)    Platelets 112 (*)    All other components within normal limits  LIPASE, BLOOD  URINALYSIS, ROUTINE W REFLEX MICROSCOPIC  I-STAT CG4 LACTIC ACID, ED    CT ABDOMEN PELVIS W CONTRAST  Final Result    DG Chest 2 View  Final Result      Medications  sodium chloride 0.9 % bolus 1,000  mL (0 mLs Intravenous Stopped 07/06/18 1754)  sodium chloride 0.9 % bolus 1,000 mL (0 mLs Intravenous Stopped 07/06/18 1754)  fentaNYL (SUBLIMAZE) injection 25 mcg (25 mcg Intravenous Given 07/06/18 1636)  iohexol (OMNIPAQUE) 300 MG/ML solution 100 mL (100 mLs Intravenous Contrast Given 07/06/18 1757)     Procedures Critical Care  ED Course and Medical Decision Making  I have reviewed the triage vital signs and the nursing notes.  Pertinent labs & imaging results that were available during my care of the patient were reviewed by me and considered in my medical decision making (see below for details).  Concern for intra-abdominal infection or blockage in 62 year old male who looks and feels unwell, soft blood pressure here in the ED.  Diffuse tenderness.  Labs and CT pending, will provide fluid resuscitation.  Labs reassuring, normal lactate, no leukocytosis, no AKI.  Patient feeling much better after IV fluids, blood pressure has been normal for several hours here in the ED.  CT reveals normal caliber aorta, no acute surgical process.  Consistent with constipation and also possibly gastroenteritis.  Patient feeling better and requesting discharge, agrees to an aggressive constipation regimen at home.  Strict return precautions.  After the discussed management above, the patient was determined to be safe for discharge.  The patient was in agreement with this plan and all questions regarding their care were answered.  ED return precautions were discussed and the patient will return to the ED with any significant worsening of condition.  Elmer Sow. Pilar Plate, MD [facility] Health Emergency Medicine [facility] [email or web address]  Final Clinical Impressions(s) / ED Diagnoses     ICD-10-CM   1. Abdominal pain, unspecified abdominal location R10.9   2. Constipation, unspecified constipation type K59.00     ED Discharge Orders    None         Sabas Sous, MD 07/06/18 2049

## 2018-07-06 NOTE — ED Notes (Signed)
Patient transported to CT 

## 2019-12-31 IMAGING — DX DG CHEST 2V
2 series · 2 of 2 positions shown · non-contrast
Comparison: 05/11/2006

CLINICAL DATA: Flu like symptoms since [REDACTED].  Productive cough.

EXAM:
CHEST - 2 VIEW

[chest pa]
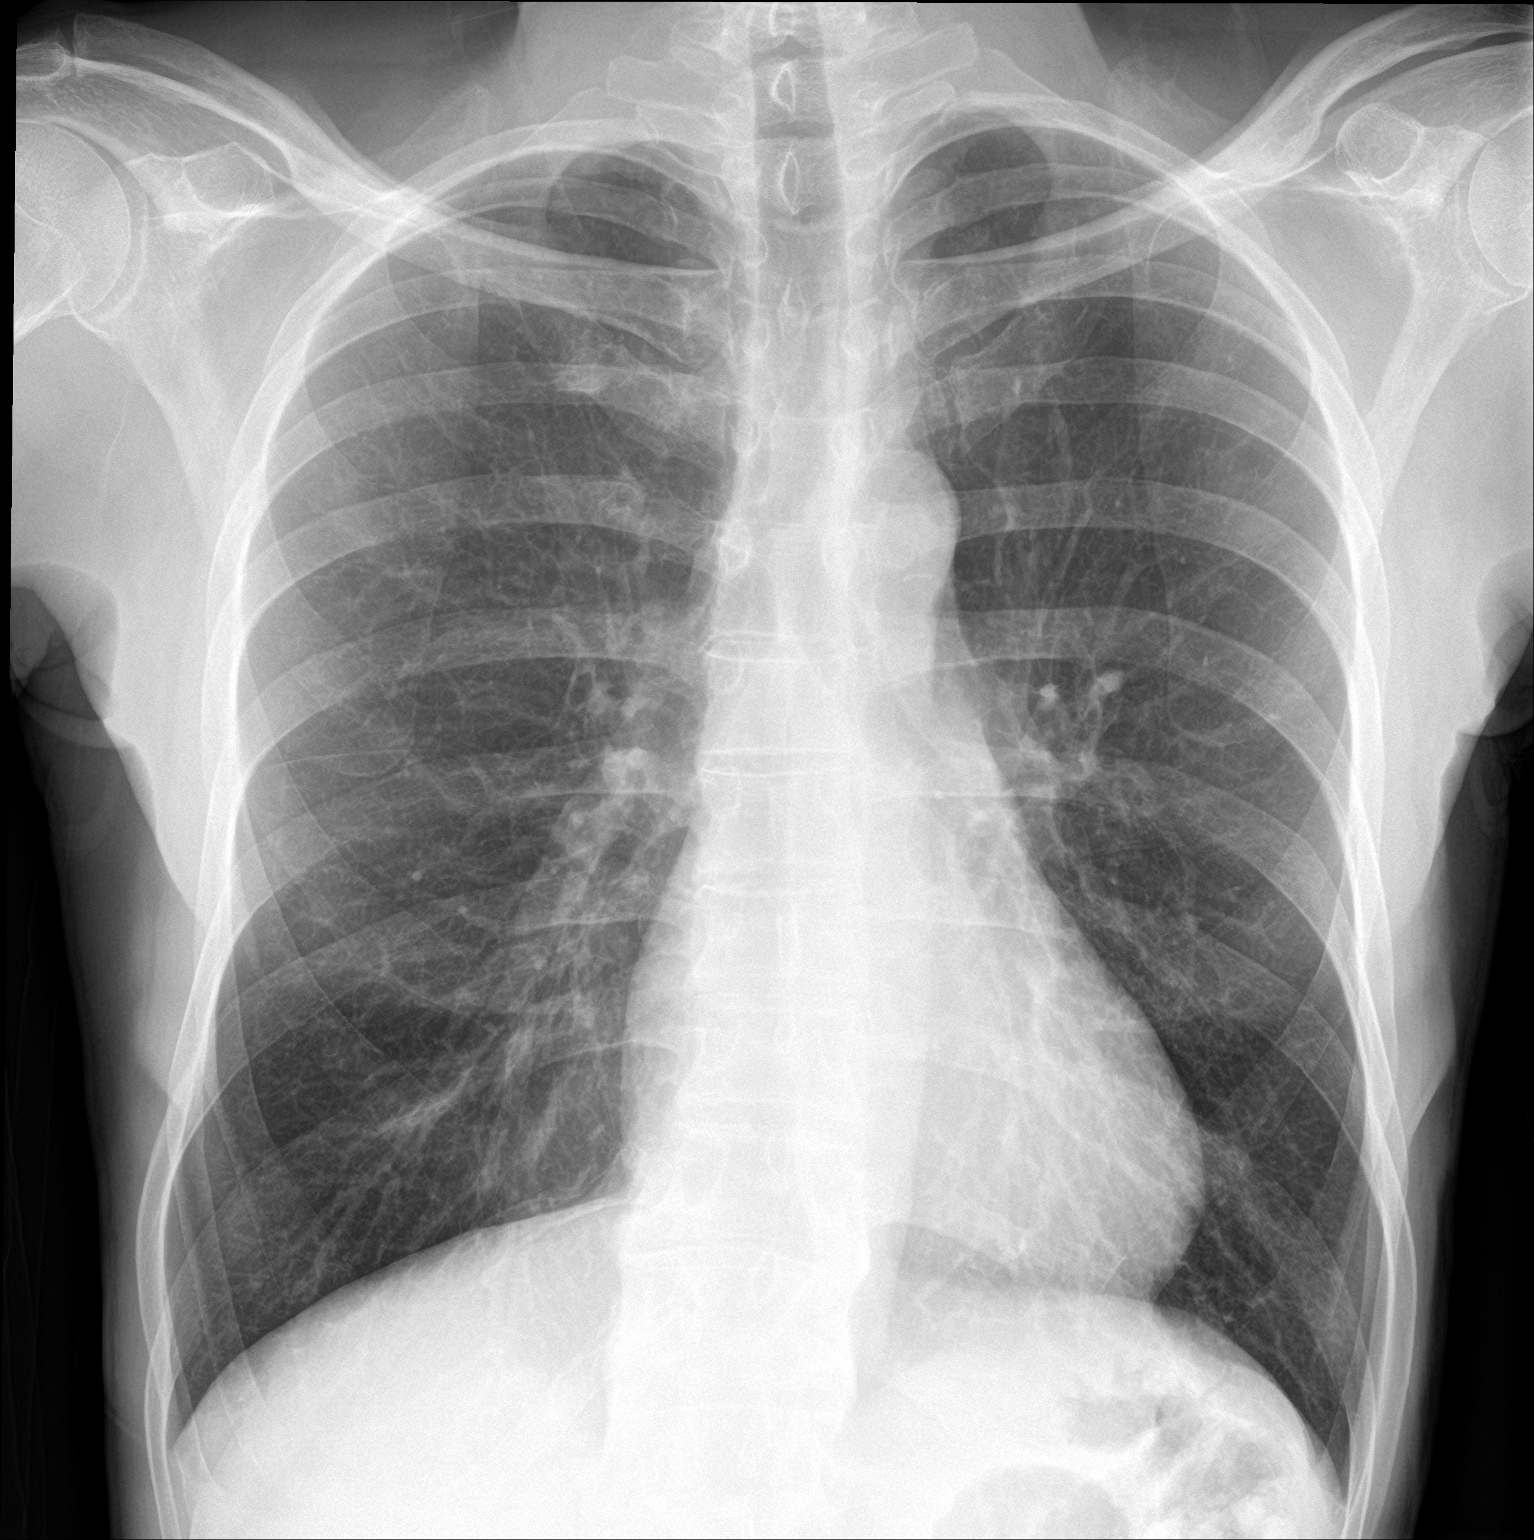

[chest lat]
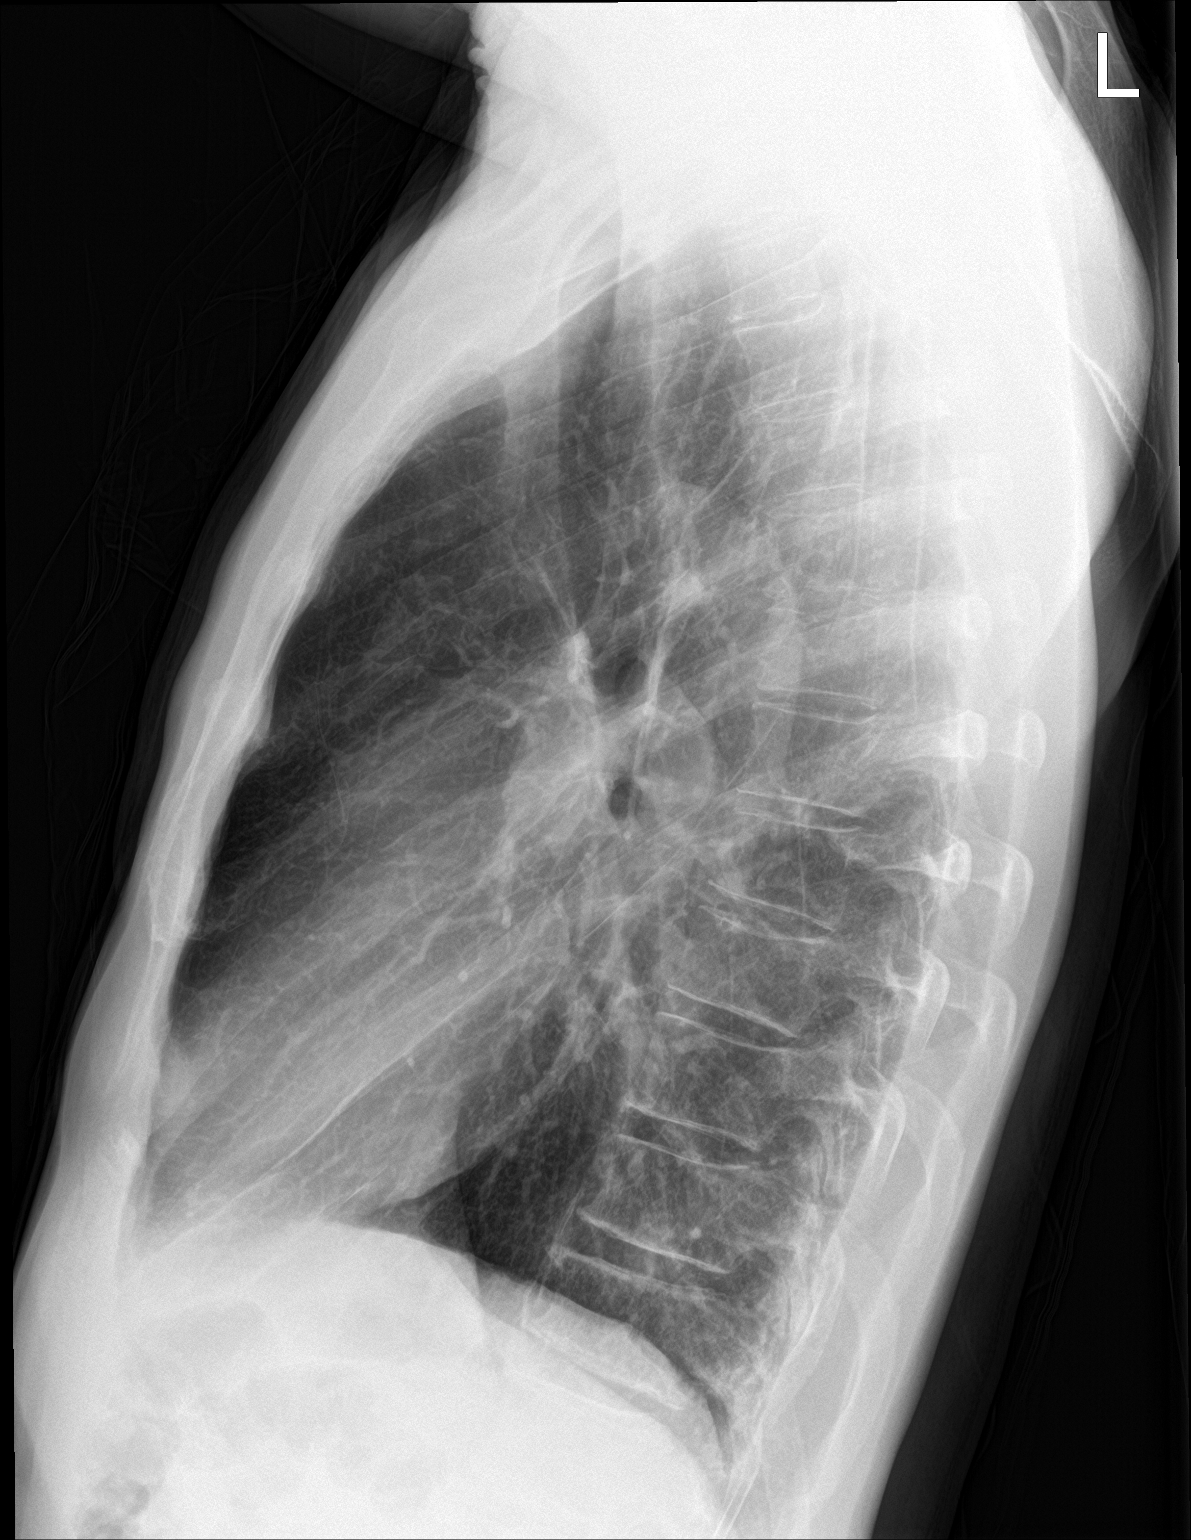

[2 of 2 positions shown; findings below may reference images not displayed]

FINDINGS: The cardiac silhouette, mediastinal and hilar contours are within
normal limits. The lungs are clear. No infiltrates, edema or
effusions. There is mild hyperinflation and findings suggesting
early emphysematous changes. The bony thorax is intact.
IMPRESSION: Hyperinflation and early emphysematous changes but no acute
pulmonary process.

## 2019-12-31 IMAGING — CT CT ABD-PELV W/ CM
2 of 5 series · 15 of 46 positions shown, 17 images · IV contrast (APPLIED)
Comparison: None.

CLINICAL DATA: Abdominal pain with flu-like symptoms since [REDACTED].
Constipation. Cough, congestion, back pain, headache and generalized
malaise and fatigue.

EXAM:
CT ABDOMEN AND PELVIS WITH CONTRAST
TECHNIQUE: Multidetector CT imaging of the abdomen and pelvis was performed
using the standard protocol following bolus administration of
intravenous contrast.
CONTRAST:  100mL OMNIPAQUE IOHEXOL 300 MG/ML  SOLN

[Series 3: abd/ pelvis 5.0 i30f 2 · axial · 0.67mm/px · z∈[+651,+1061]mm · 12 of 92 slices shown, 14 images]
[im 5/92  soft-tissue]
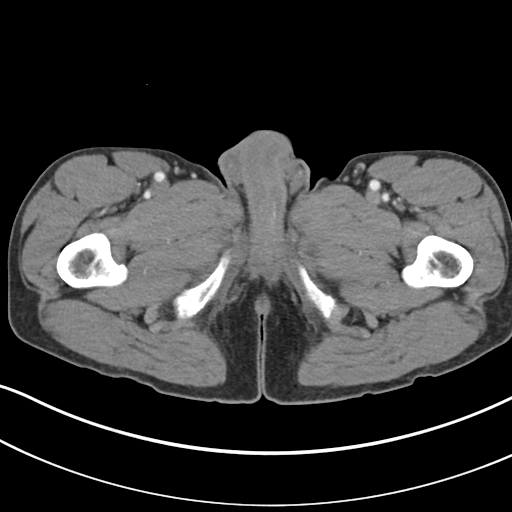
[im 5/92  bone]
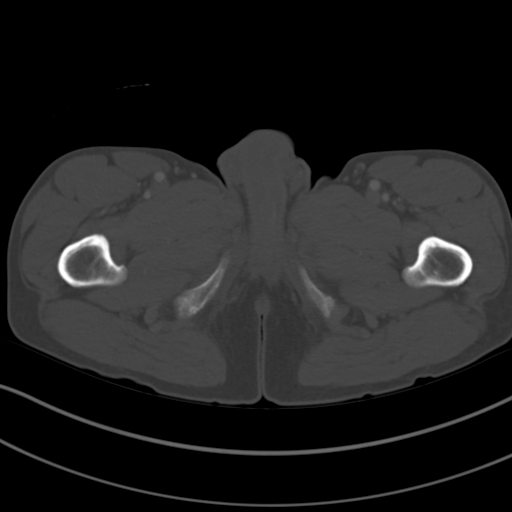
[im 15/92  soft-tissue]
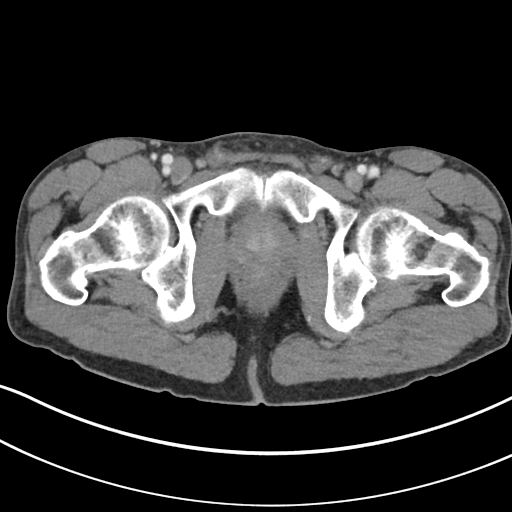
[im 20/92  soft-tissue]
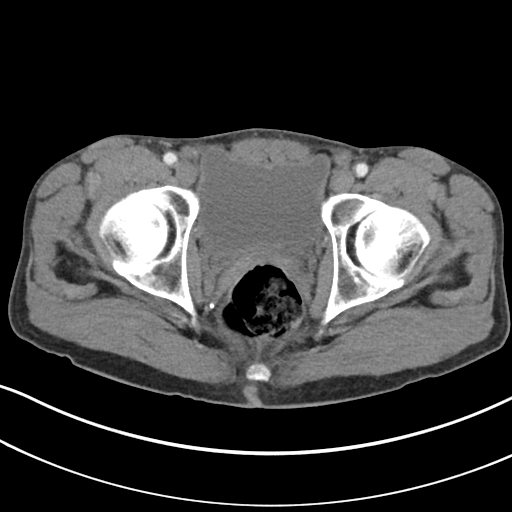
[im 29/92  soft-tissue]
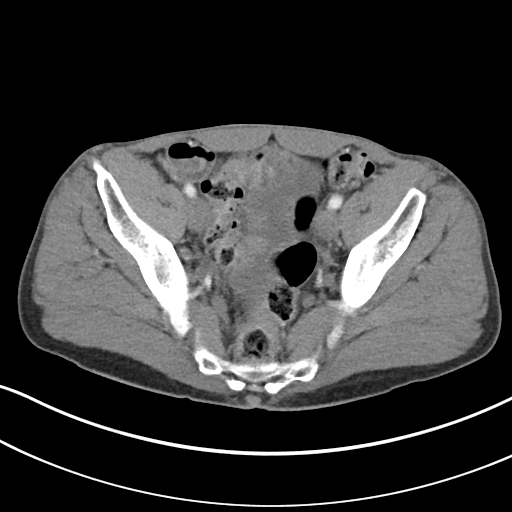
[im 34/92  soft-tissue]
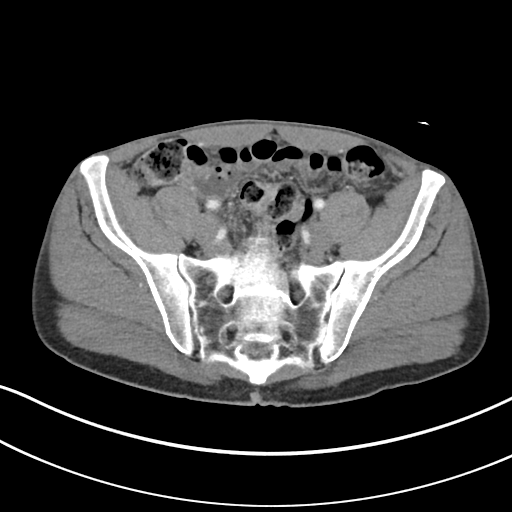
[im 44/92  soft-tissue]
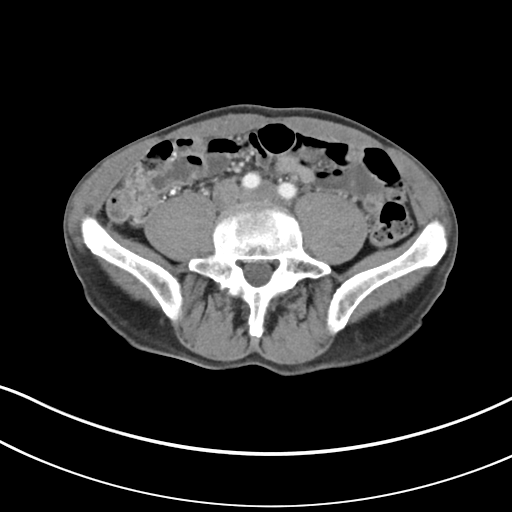
[im 48/92  soft-tissue]
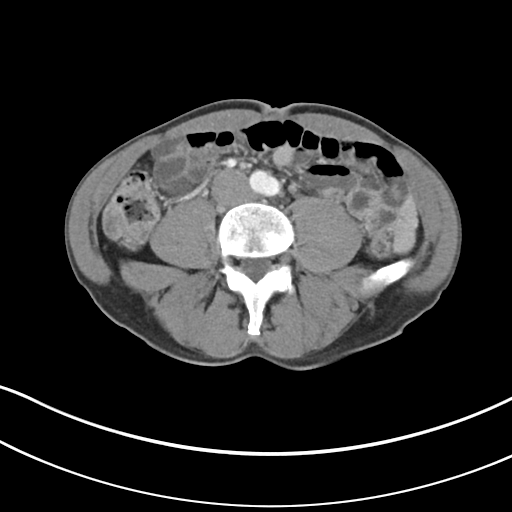
[im 58/92  soft-tissue]
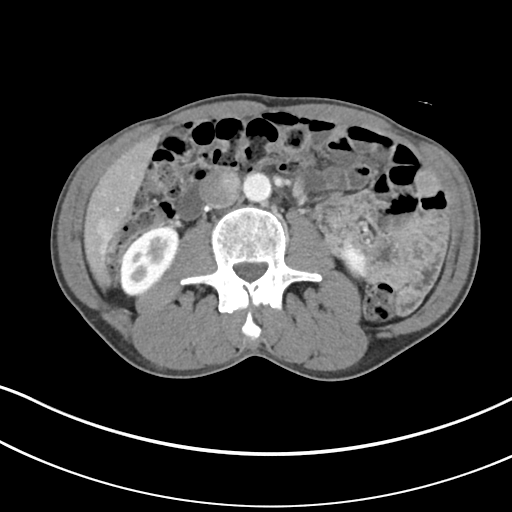
[im 63/92  soft-tissue]
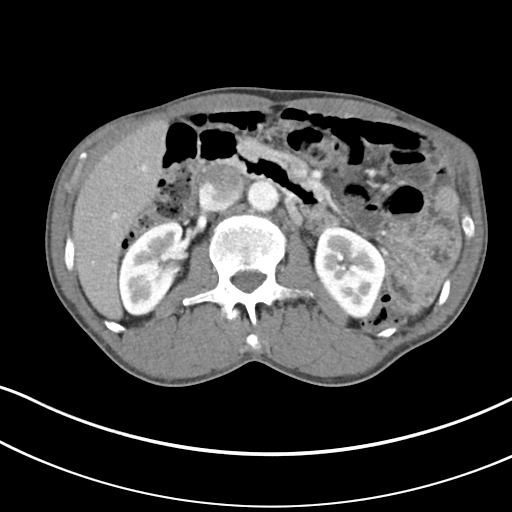
[im 63/92  bone]
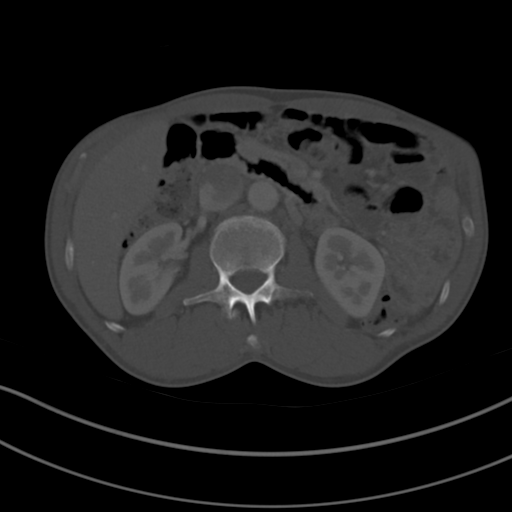
[im 72/92  soft-tissue]
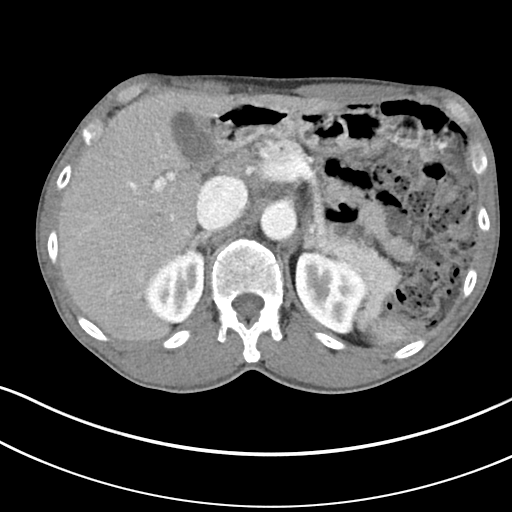
[im 77/92  soft-tissue]
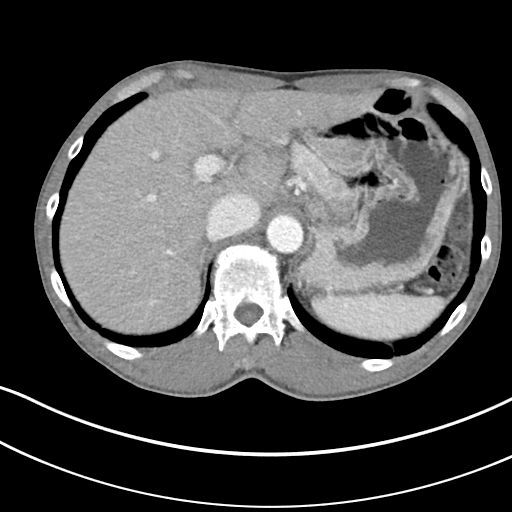
[im 87/92  soft-tissue]
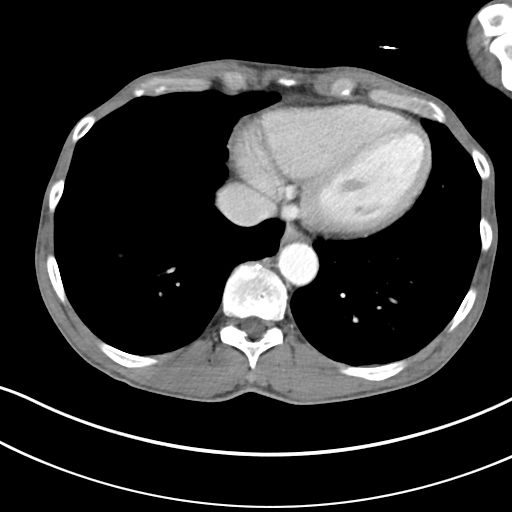

[Series 6: coronal soft tissue · coronal · 0.69mm/px · 3 of 100 slices shown]
[im 34/100  soft-tissue]
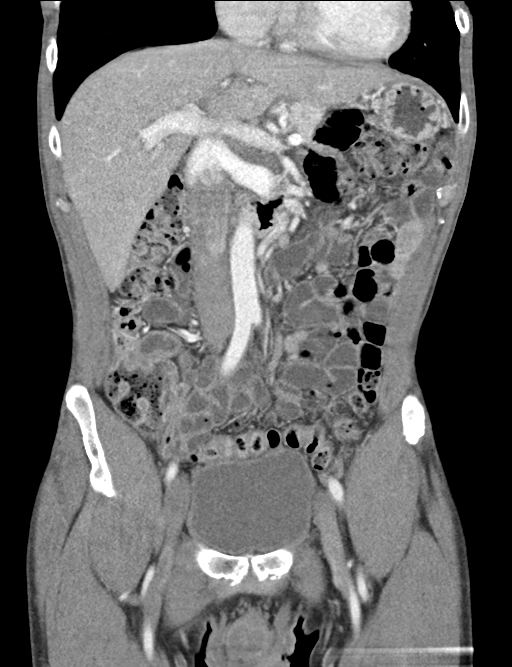
[im 45/100  soft-tissue]
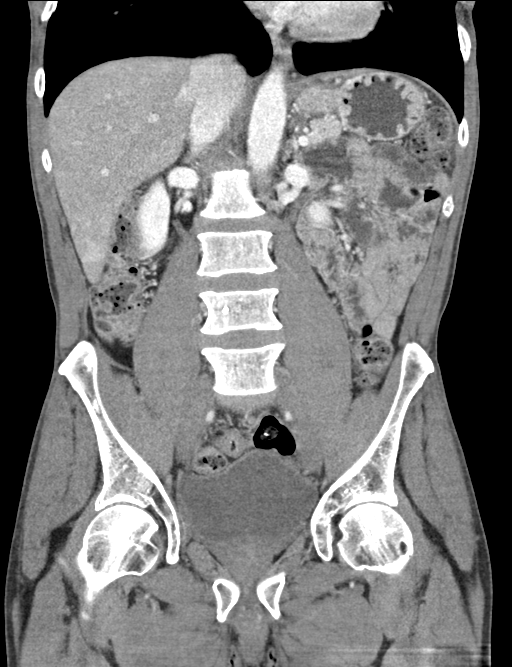
[im 56/100  soft-tissue]
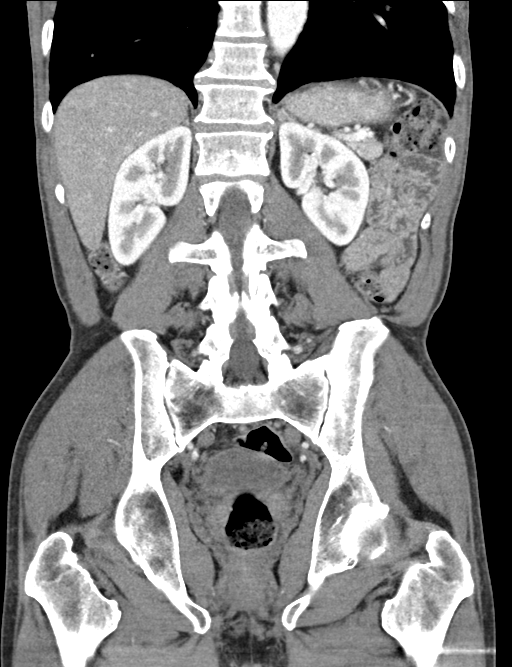

[15 of 46 positions shown; findings below may reference images not displayed]

FINDINGS: Lower chest: No acute abnormality.

Hepatobiliary: Ill-defined hyperdense lesion within the inferior
segment of the RIGHT liver lobe, most likely atypical hemangioma or
anomalous vasculature. No additional mass or lesion within the
liver. Gallbladder appears normal. No bile duct dilatation seen.

Pancreas: Unremarkable. No pancreatic ductal dilatation or
surrounding inflammatory changes.

Spleen: Normal in size without focal abnormality.

Adrenals/Urinary Tract: Adrenal glands appear normal. Kidneys are
unremarkable without mass, stone or hydronephrosis. No perinephric
fluid seen. No ureteral or bladder calculi identified. Bladder
appears normal.

Stomach/Bowel: No dilated large or small bowel loops. Fairly large
amount of stool throughout the nondistended colon compatible with
the given history of constipation. No convincing evidence of bowel
wall inflammation seen. Stomach is unremarkable, fluid-filled. Fluid
is also seen throughout a significant portion of the nondistended
small bowel, with associated air-fluid levels, raising the
possibility of gastroenteritis.

Visualized portions of the appendix appear normal.

Vascular/Lymphatic: Aortic atherosclerosis. No enlarged abdominal or
pelvic lymph nodes.

Reproductive: Prostate is unremarkable.

Other: No free fluid or abscess collection. No free intraperitoneal
air.

Musculoskeletal: Small indeterminate mixed density lesions within
the iliac bones bilaterally, LEFT greater than RIGHT, appearance
most suggestive of a benign etiology. No acute or suspicious osseous
lesion. Superficial soft tissues are unremarkable.
IMPRESSION: 1. Fluid throughout the stomach and a significant portion of the
nondistended small bowel, with associated air-fluid levels, raising
the possibility of gastroenteritis. No bowel obstruction.
2. Fairly large amount of stool throughout the nondistended colon
compatible with the given history of constipation.
3. **An incidental finding of potential clinical significance has
been found. Ill-defined hyperdense lesion within the inferior
segment of the RIGHT liver lobe, most likely atypical hemangioma or
anomalous vasculature. Recommend follow-up with nonemergent liver
MRI to ensure benignity.**
4. **An additional incidental finding of potential clinical
significance has been found. Small indeterminate mixed density
lesions within the iliac bones bilaterally, LEFT greater than RIGHT,
appearance most suggestive of a benign etiology such as bone islands
or hemangiomas. Consider nonemergent bone scan to ensure
benignity.**
5. Additional chronic/incidental findings detailed above.

Aortic Atherosclerosis (CPFT5-7P6.6).

## 2024-08-08 NOTE — Progress Notes (Unsigned)
 "     Benjamin Console, [name] [address] [place], [place]  [address] Phone: [phone]   Gastroenterology Consultation  Referring Provider:     Jegede, Olugbemiga E, MD Primary Care Physician:  Jegede, Olugbemiga E, MD Primary Gastroenterologist:  Benjamin Console, [name] / Benjamin Naval, MD  Reason for Consultation:     Discuss screening colonoscopy        HPI:   Discussed the use of AI scribe software for clinical note transcription with the patient, who gave verbal consent to proceed.  Patient is referred from PCP to schedule a screening colonoscopy.    Remote history of hepatitis C treated in 2014.  04/2024 labs: Normal CBC, CMP, and LFTs.  HIV negative.  Hepatitis C antibody reactive.  HCV RNA PCR quantitative less than 15, not detected.  Hepatitis B surface antigen nonreactive. History of Present Illness Colorectal cancer screening and colonic polyps - Colonoscopy last performed in September 2008 with polypectomy. - Polyps were described as non-cancerous at the time, with additional findings addressed. - No repeat colonoscopy since 2008. - No family history of colon cancer.  Gastrointestinal symptoms - No current abdominal pain, heartburn, dysphagia, nausea, vomiting, diarrhea, or rectal bleeding.  Questionable history of peptic ulcer disease - Peptic ulcer disease managed intermittently with over-the-counter ranitidine . - Patient had upper abdominal pain following cervical fusion surgery in 2023. - He attributed his upper abdominal pain to peptic ulcer (not verified by any testing).   - He has never had an upper endoscopy.   - He took gabapentin in 2023 which he thought may have caused an ulcer.   - His upper abdominal pain resolved after he discontinued gabapentin and started ranitidine . - No current abdominal pain. - He denies NSAID use.  Bowel habits and constipation - Severe constipation during prolonged hospital stay, required stool softeners, with one episode  of significant pain and difficulty with defecation. - Since relocating to [place]  in March of previous year, bowel movements have been regular, averaging two per day. - Bowel habits managed with high fiber diet and regular exercise. - No current constipation. - Not using laxatives. - Fluid intake less than 64 ounces daily except in hot weather.     Past Medical History:  Diagnosis Date   Arthritis    HLD (hyperlipidemia)    Peptic ulcer     Past Surgical History:  Procedure Laterality Date   CERVICAL FUSION     C1-C4   DENTAL SURGERY      Prior to Admission medications  Medication Sig Start Date End Date Taking? Authorizing Provider  polyethylene glycol (MIRALAX  / GLYCOLAX ) packet Take 17 g by mouth daily. 06/04/17   Patsey Lot, MD  ranitidine  (ZANTAC ) 300 MG tablet Take 1 tablet (300 mg total) by mouth at bedtime. 06/04/17   Patsey Lot, MD    Family History  Problem Relation Age of Onset   Breast cancer Mother    Arthritis Mother    Breast cancer Sister    Hypertension Sister    Aortic dissection Brother    Stomach cancer Maternal Grandfather      Social History[1]  Allergies as of 08/09/2024 - Review Complete 08/09/2024  Allergen Reaction Noted   Gabapentin  06/02/2022   Penicillins  07/31/2010    Review of Systems:    All systems reviewed and negative except where noted in HPI.   Physical Exam:  BP 90/60 (BP Location: Left Arm, Patient Position: Sitting, Cuff Size: Normal)   Pulse  76   Ht 5' 9.5 (1.765 m) Comment: height measured without shoes  Wt 158 lb 2 oz (71.7 kg)   BMI 23.02 kg/m  No LMP for male patient.  General:   Alert,  Well-developed, well-nourished, pleasant and cooperative in NAD Lungs:  Respirations even and unlabored.  Clear throughout to auscultation.   No wheezes, crackles, or rhonchi. No acute distress. Heart:  Regular rate and rhythm; no murmurs, clicks, rubs, or gallops. Abdomen:  Normal bowel sounds.  No  bruits.  Soft, and non-distended without masses, hepatosplenomegaly or hernias noted.  No Tenderness.  No guarding or rebound tenderness.    Neurologic:  Alert and oriented x3;  grossly normal neurologically. Psych:  Alert and cooperative. Normal mood and affect.   Imaging Studies: No results found.  Labs: CBC    Component Value Date/Time   WBC 3.6 (L) 07/06/2018 1556   RBC 4.82 07/06/2018 1556   HGB 14.3 07/06/2018 1556   HCT 43.7 07/06/2018 1556   PLT 112 (L) 07/06/2018 1556   MCV 90.7 07/06/2018 1556    CMP     Component Value Date/Time   NA 137 07/06/2018 1556   K 4.1 07/06/2018 1556   CL 104 07/06/2018 1556   CO2 22 07/06/2018 1556   GLUCOSE 105 (H) 07/06/2018 1556   BUN 9 07/06/2018 1556   CREATININE 1.01 07/06/2018 1556   CREATININE 1.10 02/09/2013 1645   CALCIUM 8.6 (L) 07/06/2018 1556   PROT 6.9 07/06/2018 1556   ALBUMIN 3.8 07/06/2018 1556   AST 23 07/06/2018 1556   ALT 16 07/06/2018 1556   ALKPHOS 51 07/06/2018 1556   BILITOT 0.5 07/06/2018 1556   GFRNONAA >60 07/06/2018 1556   GFRNONAA 74 02/09/2013 1645   GFRAA >60 07/06/2018 1556   GFRAA 86 02/09/2013 1645    Assessment and Plan:   Benjamin Bender is a 69 y.o. y/o male has been referred for:  1.  Colon cancer screening / Remote history of colon polyps in 2008, reports unavailable. He has not had a repeat colonoscopy since 2008. - Scheduling Colonoscopy I discussed risks of colonoscopy with patient to include risk of bleeding, colon perforation, and risk of sedation.  Patient expressed understanding and agrees to proceed with colonoscopy.   2.  Remote history of hepatitis C treated in 2014.  Recent HCV RNA quantitative PCR less than 5, undetectable.  Consistent with successful treatment.  3.  Intermittent constipation: Currently improved with high-fiber diet and regular exercise. - Advised him to take OTC MiraLAX  if he has any recurrent constipation in the future. - Continue high-fiber diet,  fruits, vegetables, whole grains, and 64 ounces of fluids daily.  4.  If patient has recurrent upper abdominal pain in the future, advised him to let us  know and we can schedule EGD if needed.  He has no current abdominal pain or upper GI symptoms to warrant EGD at this time. - Continue to avoid NSAIDs to prevent peptic ulcer disease.  Follow up as needed based on colonoscopy results and GI symptoms.  Benjamin Console, [name]       [1]  Social History Tobacco Use   Smoking status: Light Smoker    Current packs/day: 0.25    Types: Cigarettes   Smokeless tobacco: Never  Vaping Use   Vaping status: Never Used  Substance Use Topics   Alcohol use: Yes    Comment: occasionally   Drug use: Yes    Types: Marijuana   "

## 2024-08-09 ENCOUNTER — Encounter: Payer: Self-pay | Admitting: Physician Assistant

## 2024-08-09 ENCOUNTER — Ambulatory Visit: Payer: PRIVATE HEALTH INSURANCE | Admitting: Physician Assistant

## 2024-08-09 VITALS — BP 90/60 | HR 76 | Ht 69.5 in | Wt 158.1 lb

## 2024-08-09 DIAGNOSIS — Z1211 Encounter for screening for malignant neoplasm of colon: Secondary | ICD-10-CM

## 2024-08-09 DIAGNOSIS — K5900 Constipation, unspecified: Secondary | ICD-10-CM | POA: Diagnosis not present

## 2024-08-09 DIAGNOSIS — Z8619 Personal history of other infectious and parasitic diseases: Secondary | ICD-10-CM | POA: Diagnosis not present

## 2024-08-09 DIAGNOSIS — Z860100 Personal history of colon polyps, unspecified: Secondary | ICD-10-CM | POA: Diagnosis not present

## 2024-08-09 MED ORDER — NA SULFATE-K SULFATE-MG SULF 17.5-3.13-1.6 GM/177ML PO SOLN: 1.0000 | Freq: Once | ORAL | 0 refills | Status: AC

## 2024-08-09 NOTE — Patient Instructions (Signed)
 You have been scheduled for a Colonoscopy. Please follow written instructions given to you at your visit today.   If you use inhalers (even only as needed), please bring them with you on the day of your procedure.  DO NOT TAKE 7 DAYS PRIOR TO TEST- Trulicity (dulaglutide) Ozempic, Wegovy (semaglutide) Mounjaro (tirzepatide) Bydureon Bcise (exanatide extended release)  DO NOT TAKE 1 DAY PRIOR TO YOUR TEST Rybelsus (semaglutide) Adlyxin (lixisenatide) Victoza (liraglutide) Byetta (exanatide) ___________________________________________________________________________  Please follow up sooner if symptoms increase or worsen   Due to recent changes in healthcare laws, you may see the results of your imaging and laboratory studies on MyChart before your provider has had a chance to review them.  We understand that in some cases there may be results that are confusing or concerning to you. Not all laboratory results come back in the same time frame and the provider may be waiting for multiple results in order to interpret others.  Please give us  48 hours in order for your provider to thoroughly review all the results before contacting the office for clarification of your results.   Thank you for trusting me with your gastrointestinal care!   [name], [name] _______________________________________________________  If your blood pressure at your visit was 140/90 or greater, please contact your primary care physician to follow up on this.  _______________________________________________________  If you are age 56 or older, your body mass index should be between 23-30. Your Body mass index is 23.02 kg/m. If this is out of the aforementioned range listed, please consider follow up with your Primary Care Provider.  If you are age 79 or younger, your body mass index should be between 19-25. Your Body mass index is 23.02 kg/m. If this is out of the aformentioned range listed, please consider  follow up with your Primary Care Provider.   ________________________________________________________  The [facility] providers would like to encourage you to use MYCHART to communicate with providers for non-urgent requests or questions.  Due to long hold times on the telephone, sending your provider a message by [facility] may be a faster and more efficient way to get a response.  Please allow 48 business hours for a response.  Please remember that this is for non-urgent requests.  _______________________________________________________

## 2024-08-10 NOTE — Progress Notes (Signed)
 Agree with assessment / plan as outlined.

## 2024-08-17 ENCOUNTER — Encounter: Payer: Self-pay | Admitting: Internal Medicine

## 2024-08-17 ENCOUNTER — Ambulatory Visit: Admitting: Gastroenterology

## 2024-08-17 ENCOUNTER — Ambulatory Visit: Admitting: Internal Medicine

## 2024-08-17 ENCOUNTER — Encounter: Payer: Self-pay | Admitting: Gastroenterology

## 2024-08-17 VITALS — BP 115/85 | HR 55 | Temp 97.4°F | Resp 14 | Ht 59.5 in | Wt 158.0 lb

## 2024-08-17 VITALS — BP 128/80 | HR 62 | Temp 97.4°F | Ht 69.5 in | Wt 158.0 lb

## 2024-08-17 DIAGNOSIS — K635 Polyp of colon: Secondary | ICD-10-CM

## 2024-08-17 DIAGNOSIS — D122 Benign neoplasm of ascending colon: Secondary | ICD-10-CM

## 2024-08-17 DIAGNOSIS — D124 Benign neoplasm of descending colon: Secondary | ICD-10-CM

## 2024-08-17 DIAGNOSIS — D120 Benign neoplasm of cecum: Secondary | ICD-10-CM

## 2024-08-17 DIAGNOSIS — D125 Benign neoplasm of sigmoid colon: Secondary | ICD-10-CM | POA: Diagnosis not present

## 2024-08-17 DIAGNOSIS — K648 Other hemorrhoids: Secondary | ICD-10-CM

## 2024-08-17 DIAGNOSIS — D123 Benign neoplasm of transverse colon: Secondary | ICD-10-CM

## 2024-08-17 DIAGNOSIS — Z1211 Encounter for screening for malignant neoplasm of colon: Secondary | ICD-10-CM

## 2024-08-17 MED ORDER — SODIUM CHLORIDE 0.9 % IV SOLN: 500.0000 mL | Freq: Once | INTRAVENOUS | Status: DC

## 2024-08-17 NOTE — Progress Notes (Unsigned)
 Sedate, gd SR, tolerated procedure well, VSS, report to RN

## 2024-08-17 NOTE — Op Note (Signed)
 [facility] Patient Name: Benjamin Bender Procedure Date: 08/17/2024 1:51 PM MRN: [id number] Endoscopist: Rosario Estefana Kidney , , [id number] Age: 69 Referring MD:  Date of Birth: 02/19/1956 Gender: Male Account #: [id number] Procedure:                Colonoscopy Indications:              Screening for colorectal malignant neoplasm Medicines:                Monitored Anesthesia Care Procedure:                Pre-Anesthesia Assessment:                           - Prior to the procedure, a History and Physical                            was performed, and patient medications and                            allergies were reviewed. The patient's tolerance of                            previous anesthesia was also reviewed. The risks                            and benefits of the procedure and the sedation                            options and risks were discussed with the patient.                            All questions were answered, and informed consent                            was obtained. Prior Anticoagulants: The patient has                            taken no anticoagulant or antiplatelet agents. ASA                            Grade Assessment: II - A patient with mild systemic                            disease. After reviewing the risks and benefits,                            the patient was deemed in satisfactory condition to                            undergo the procedure.                           After obtaining informed consent, the colonoscope  was passed under direct vision. Throughout the                            procedure, the patient's blood pressure, pulse, and                            oxygen saturations were monitored continuously. The                            Olympus CF-HQ190L ([id number]) Colonoscope was                            introduced through the anus and advanced to the the                            terminal  ileum. The colonoscopy was performed                            without difficulty. The patient tolerated the                            procedure well. The quality of the bowel                            preparation was good. The terminal ileum, ileocecal                            valve, appendiceal orifice, and rectum were                            photographed. Scope In: 3:18:34 PM Scope Out: 3:54:53 PM Scope Withdrawal Time: 0 hours 18 minutes 46 seconds  Total Procedure Duration: 0 hours 36 minutes 19 seconds  Findings:                 The terminal ileum appeared normal.                           Seven sessile polyps were found in the transverse                            colon, ascending colon and cecum. The polyps were 3                            to 10 mm in size. These polyps were removed with a                            cold snare. Resection and retrieval were complete.                           A 12 mm polyp was found in the ascending colon. The                            polyp was sessile. The polyp was  removed with a hot                            snare. Resection and retrieval were complete.                           A 12 mm polyp was found in the transverse colon.                            The polyp was pedunculated. The polyp was removed                            with a hot snare. Resection and retrieval were                            complete.                           Three sessile polyps were found in the sigmoid                            colon and descending colon. The polyps were 3 to 7                            mm in size. These polyps were removed with a cold                            snare. Resection and retrieval were complete.                           Non-bleeding internal hemorrhoids were found during                            retroflexion. Complications:            No immediate complications. Estimated Blood Loss:     Estimated blood loss was  minimal. Impression:               - The examined portion of the ileum was normal.                           - Seven 3 to 10 mm polyps in the transverse colon,                            in the ascending colon and in the cecum, removed                            with a cold snare. Resected and retrieved.                           - One 12 mm polyp in the ascending colon, removed                            with a hot snare. Resected and  retrieved.                           - One 12 mm polyp in the transverse colon, removed                            with a hot snare. Resected and retrieved.                           - Three 3 to 7 mm polyps in the sigmoid colon and                            in the descending colon, removed with a cold snare.                            Resected and retrieved.                           - Non-bleeding internal hemorrhoids. Recommendation:           - Discharge patient to home (with escort).                           - Await pathology results.                           - The findings and recommendations were discussed                            with the patient. Dr Estefana Federico Rosario Estefana Federico,  08/17/2024 3:59:05 PM

## 2024-08-17 NOTE — Progress Notes (Unsigned)
 "   GASTROENTEROLOGY PROCEDURE H&P NOTE   Primary Care Physician: Jegede, Olugbemiga E, MD    Reason for Procedure:   Colon cancer screening  Plan:    Colonoscopy  Patient is appropriate for endoscopic procedure(s) in the ambulatory ([facility]) setting.  The nature of the procedure, as well as the risks, benefits, and alternatives were carefully and thoroughly reviewed with the patient. Ample time for discussion and questions allowed. The patient understood, was satisfied, and agreed to proceed.     HPI: Benjamin Bender is a 69 y.o. male who presents for colonoscopy for colon cancer screening. Denies blood in stools, changes in bowel habits, or unintentional weight loss. Denies family history of colon cancer. Had colonoscopy performed in 2008 with removal of polyp at that time.  Past Medical History:  Diagnosis Date   Arthritis    HLD (hyperlipidemia)    Peptic ulcer     Past Surgical History:  Procedure Laterality Date   CERVICAL FUSION     C1-C4   DENTAL SURGERY      Prior to Admission medications  Not on File    No current outpatient medications on file.   Current Facility-Administered Medications  Medication Dose Route Frequency Provider Last Rate Last Admin   0.9 %  sodium chloride  infusion  500 mL Intravenous Once Federico Rosario BROCKS, MD        Allergies as of 08/17/2024 - Review Complete 08/17/2024  Allergen Reaction Noted   Gabapentin Other (See Comments) 06/02/2022   Penicillins Other (See Comments) 07/31/2010    Family History  Problem Relation Age of Onset   Breast cancer Mother    Arthritis Mother    Breast cancer Sister    Hypertension Sister    Aortic dissection Brother    Stomach cancer Maternal Grandfather    Rectal cancer Neg Hx    Colon cancer Neg Hx    Esophageal cancer Neg Hx     Social History   Socioeconomic History   Marital status: Divorced    Spouse name: Not on file   Number of children: 0   Years of education: Not on file    Highest education level: Not on file  Occupational History   Occupation: retired  Tobacco Use   Smoking status: Light Smoker    Current packs/day: 0.25    Types: Cigarettes   Smokeless tobacco: Never  Vaping Use   Vaping status: Never Used  Substance and Sexual Activity   Alcohol use: Yes    Comment: occasionally   Drug use: Yes    Types: Marijuana   Sexual activity: Not on file  Other Topics Concern   Not on file  Social History Narrative   Not on file   Social Drivers of Health   Tobacco Use: High Risk (08/17/2024)   Patient History    Smoking Tobacco Use: Light Smoker    Smokeless Tobacco Use: Never    Passive Exposure: Not on file  Financial Resource Strain: Not on file  Food Insecurity: Not on file  Transportation Needs: Not on file  Physical Activity: Not on file  Stress: Not on file  Social Connections: Not on file  Intimate Partner Violence: Not on file  Depression (EYV7-0): Not on file  Alcohol Screen: Not on file  Housing: Not on file  Utilities: Not on file  Health Literacy: Not on file    Physical Exam: Vital signs in last 24 hours: There were no vitals taken for this visit. GEN: NAD  EYE: Sclerae anicteric ENT: MMM CV: Non-tachycardic Pulm: No increased work of breathing GI: Soft, NT/ND NEURO:  Alert & Oriented   Estefana Kidney, MD  Gastroenterology  08/17/2024 1:53 PM  "

## 2024-08-17 NOTE — Patient Instructions (Signed)
Please read handouts provided Await pathology results.   YOU HAD AN ENDOSCOPIC PROCEDURE TODAY AT [facility]:   Refer to the procedure report that was given to you for any specific questions about what was found during the examination.  If the procedure report does not answer your questions, please call your gastroenterologist to clarify.  If you requested that your care partner not be given the details of your procedure findings, then the procedure report has been included in a sealed envelope for you to review at your convenience later.  YOU SHOULD EXPECT: Some feelings of bloating in the abdomen. Passage of more gas than usual.  Walking can help get rid of the air that was put into your GI tract during the procedure and reduce the bloating. If you had a lower endoscopy (such as a colonoscopy or flexible sigmoidoscopy) you may notice spotting of blood in your stool or on the toilet paper. If you underwent a bowel prep for your procedure, you may not have a normal bowel movement for a few days.  Please Note:  You might notice some irritation and congestion in your nose or some drainage.  This is from the oxygen used during your procedure.  There is no need for concern and it should clear up in a day or so.  SYMPTOMS TO REPORT IMMEDIATELY:  Following lower endoscopy (colonoscopy or flexible sigmoidoscopy):  Excessive amounts of blood in the stool  Significant tenderness or worsening of abdominal pains  Swelling of the abdomen that is new, acute  Fever of 100F or higher  For urgent or emergent issues, a gastroenterologist can be reached at any hour by calling [phone]. Do not use MyChart messaging for urgent concerns.    DIET:  We do recommend a small meal at first, but then you may proceed to your regular diet.  Drink plenty of fluids but you should avoid alcoholic beverages for 24 hours.  ACTIVITY:  You should plan to take it easy for the rest of today and you should  NOT DRIVE or use heavy machinery until tomorrow (because of the sedation medicines used during the test).    FOLLOW UP: Our staff will call the number listed on your records the next business day following your procedure.  We will call around 7:15- 8:00 am to check on you and address any questions or concerns that you may have regarding the information given to you following your procedure. If we do not reach you, we will leave a message.     If any biopsies were taken you will be contacted by phone or by letter within the next 1-3 weeks.  Please call us at [phone] if you have not heard about the biopsies in 3 weeks.    SIGNATURES/CONFIDENTIALITY: You and/or your care partner have signed paperwork which will be entered into your electronic medical record.  These signatures attest to the fact that that the information above on your After Visit Summary has been reviewed and is understood.  Full responsibility of the confidentiality of this discharge information lies with you and/or your care-partner.

## 2024-08-17 NOTE — Progress Notes (Unsigned)
 Pt's states no medical or surgical changes since previsit or office visit.   Patient said he had a couple sips of water around 10:15 am. Tim CRNA made aware.  Instructed patient he needed to be delayed due to drinking and the possibility of another doctor having to do the procedure. Patient agreed and verbalized understanding.  Patient added to Dr. Federico schedule and will be back at 1:00 pm. Instructed not to have anything to eat or drink. Patient verbalized understanding.

## 2024-08-17 NOTE — Progress Notes (Signed)
 Called to room to assist during endoscopic procedure.  Patient ID and intended procedure confirmed with present staff. Received instructions for my participation in the procedure from the performing physician.

## 2024-08-18 ENCOUNTER — Telehealth: Payer: Self-pay

## 2024-08-18 NOTE — Telephone Encounter (Signed)
 LMOM

## 2024-08-23 ENCOUNTER — Ambulatory Visit: Payer: Self-pay | Admitting: Internal Medicine

## 2024-08-23 LAB — SURGICAL PATHOLOGY
